# Patient Record
Sex: Female | Born: 1980 | Hispanic: Yes | Marital: Married | Smoking: Never smoker
Health system: Southern US, Community
[De-identification: ages and names within clinical notes are randomized; demographics above are authoritative.]

---

## 2003-11-03 ENCOUNTER — Inpatient Hospital Stay (HOSPITAL_COMMUNITY): Admission: AD | Admit: 2003-11-03 | Discharge: 2003-11-03 | Payer: Self-pay | Admitting: Obstetrics & Gynecology

## 2004-04-15 ENCOUNTER — Inpatient Hospital Stay (HOSPITAL_COMMUNITY): Admission: AD | Admit: 2004-04-15 | Discharge: 2004-04-18 | Payer: Self-pay | Admitting: Obstetrics

## 2005-08-08 IMAGING — US US OB COMP +14 WK
1 series · 13 of 28 positions shown · non-contrast
Comparison: none

CLINICAL DATA: 15 week 0 day gestational age by LMP.  Pelvic pain.

[Series 1: unknown · 0.27mm/px · 13 of 43 slices shown]
[im 2/43]
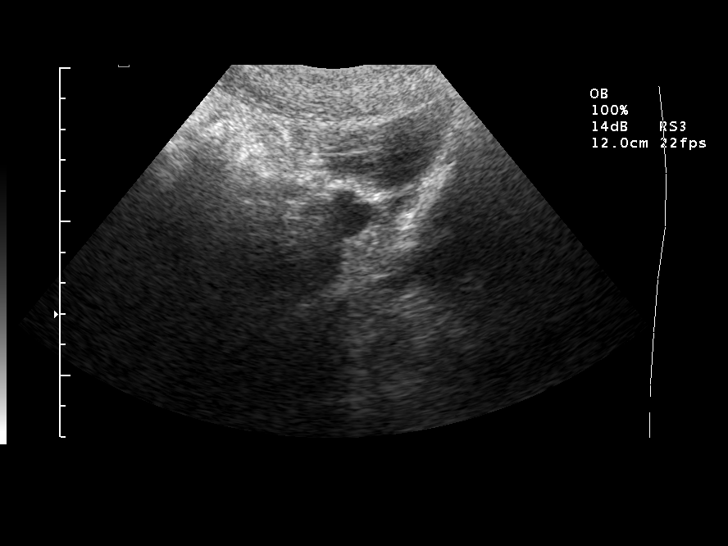
[im 5/43]
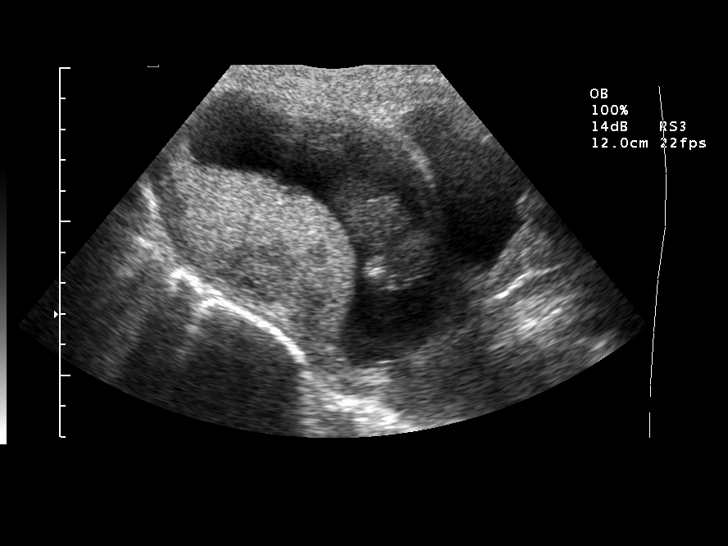
[im 8/43]
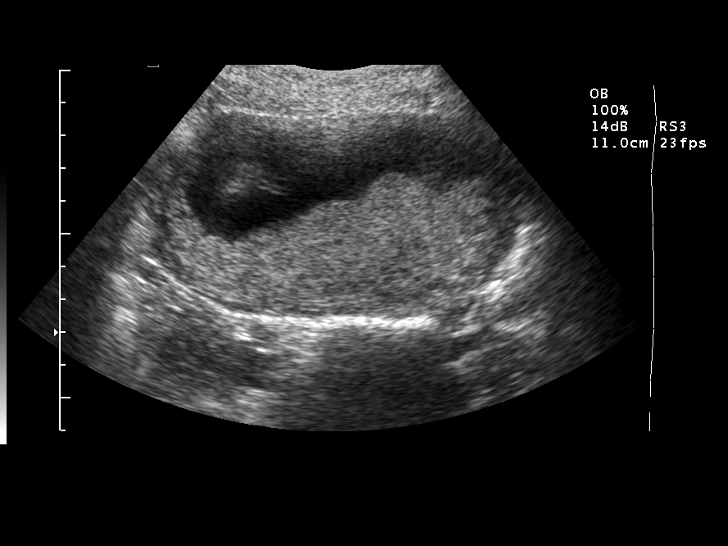
[im 11/43]
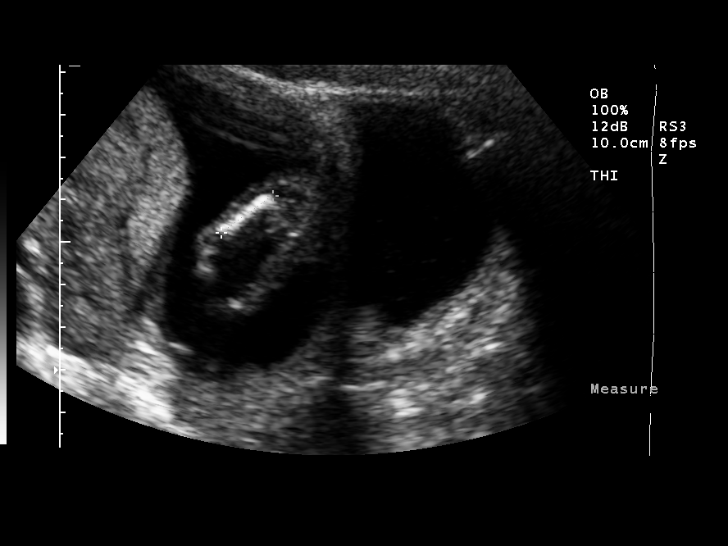
[im 15/43]
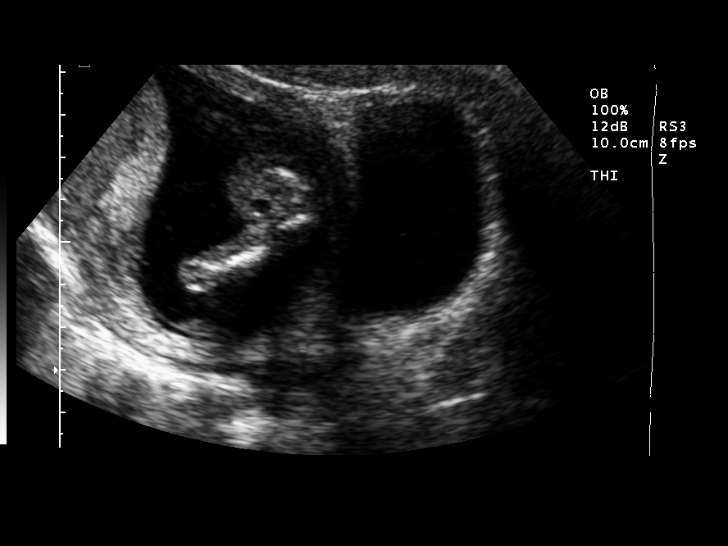
[im 18/43]
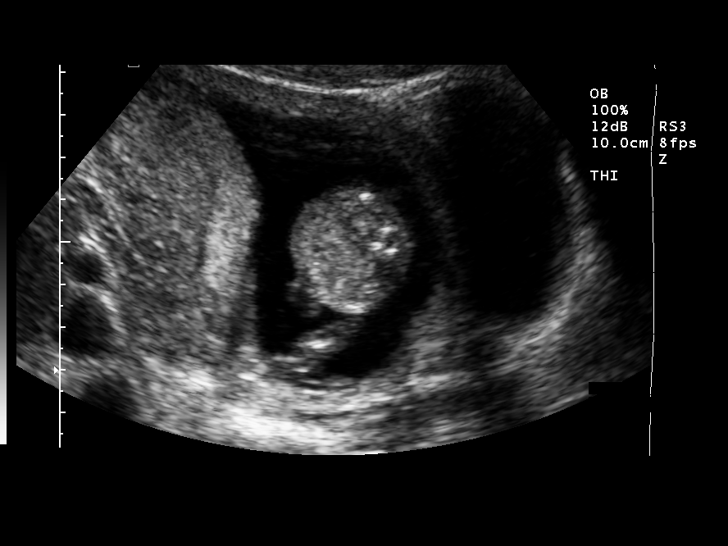
[im 22/43]
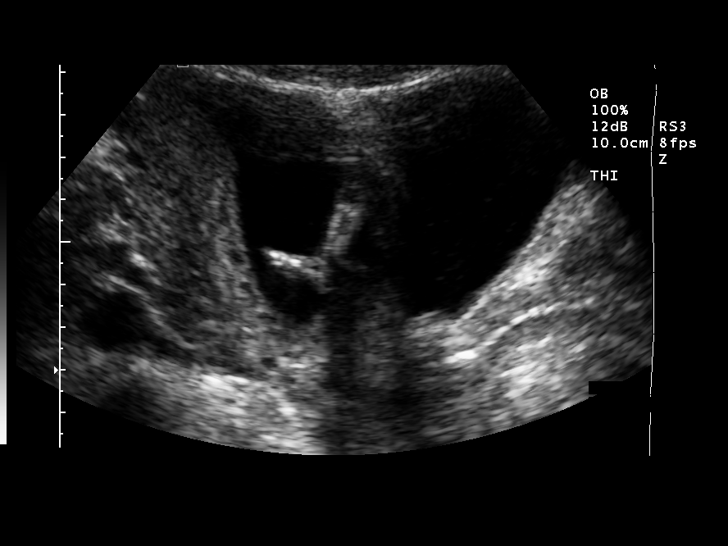
[im 25/43]
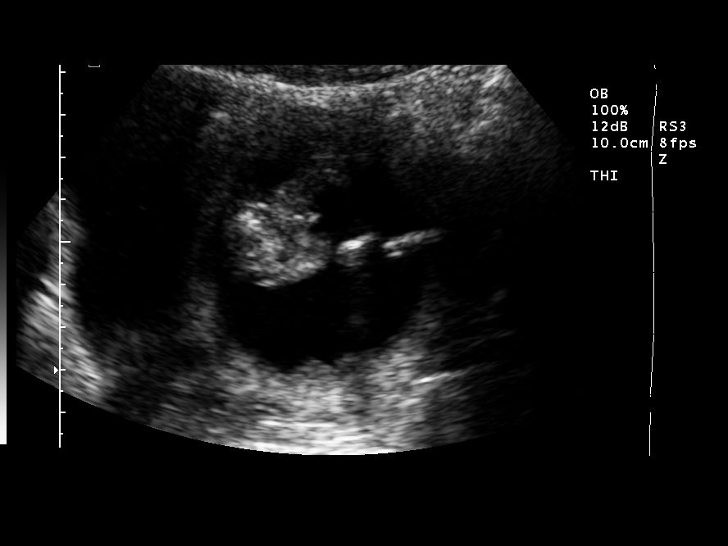
[im 29/43]
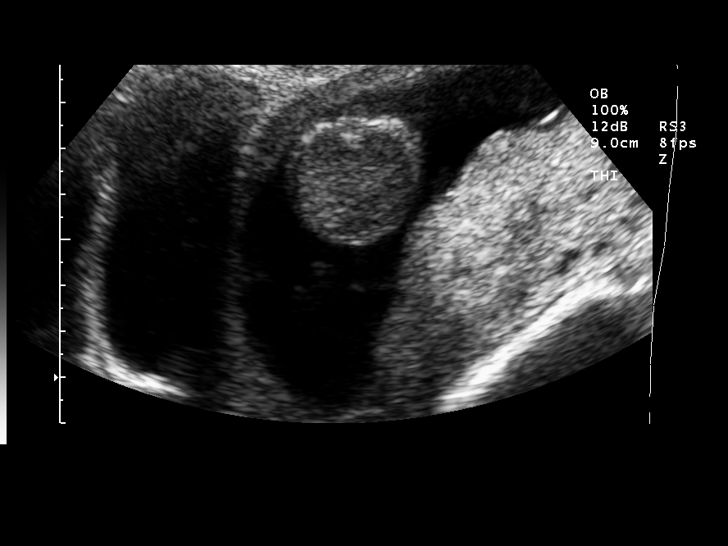
[im 32/43]
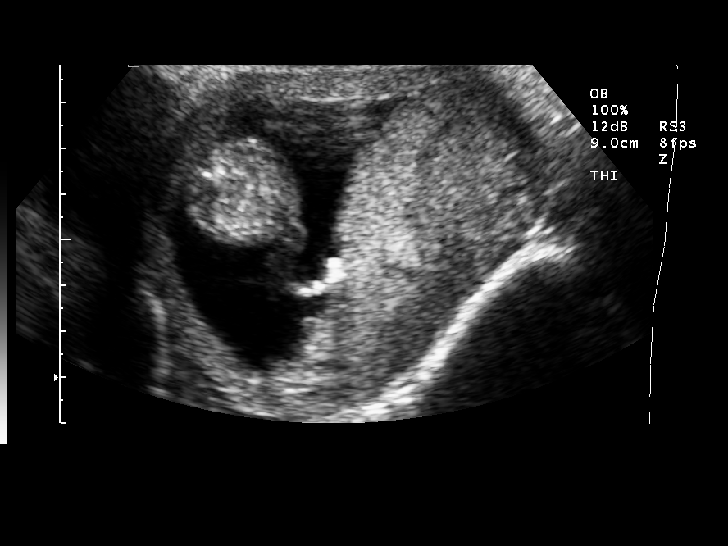
[im 35/43]
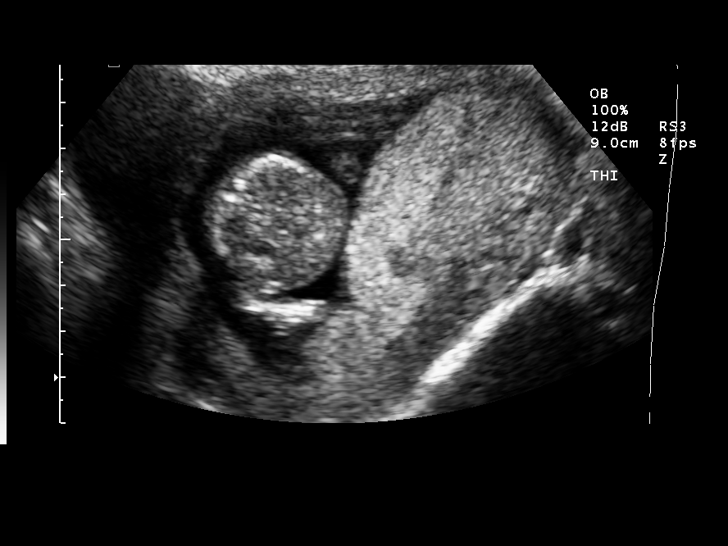
[im 38/43]
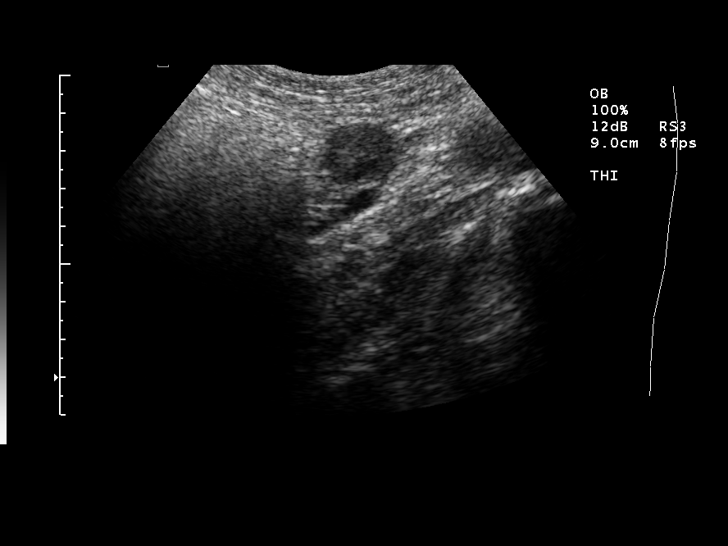
[im 41/43]
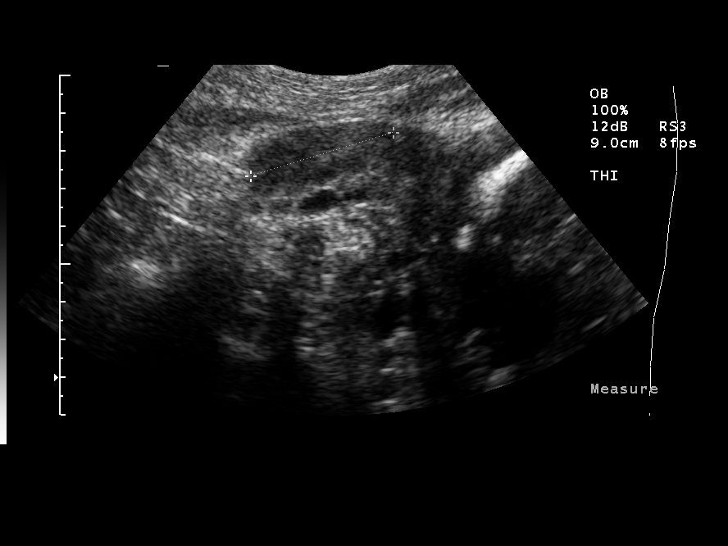

[13 of 28 positions shown; findings below may reference images not displayed]

OBSTETRICAL ULTRASOUND
 Number of Fetuses:  1
 Heart Rate:  150
 Movement:  Yes
 Breathing:  No  
 Presentation:  Breech
 Placental Location:  Posterior
 Grade:  I
 Previa:  No
 Amniotic Fluid (Subjective):  Normal
 Amniotic Fluid (Objective):   5.4 cm Vertical pocket 

 FETAL BIOMETRY
 BPD:   3.0 cm   15 w 4 d
 HC:   11.0 cm   15 w 2 d
 AC:   9.4 cm   15 w 4 d
 FL:   1.5 cm   14 w 3 d

 MEAN GA:  15 w 2 d

 FETAL ANATOMY
 Lateral Ventricles:    Choroid plexus visualized
 Thalami/CSP:      Not visualized 
 Posterior Fossa:  Not visualized 
 Nuchal Region:    Not visualized 
 Spine:      Not visualized 
 4 Chamber Heart on Left:      Not visualized 
 Stomach on Left:      Visualized 
 3 Vessel Cord:    Visualized 
 Cord Insertion site:    Visualized 
 Kidneys:  Visualized 
 Bladder:  Visualized 
 Extremities:      Visualized 

 Evaluation limited by:  Early gestational age 

 MATERNAL FINDINGS
 Cervix:   3.6 cm Transabdominally. 
 The right ovary is normal in appearance.  The left ovary is not visualized, but no left adnexal masses or free pelvic fluid are detected.
IMPRESSION: Single living intrauterine fetus with mean gestational age of 15 weeks 2 days and sonographic EDC of 04/24/04.  This correlates closely with stated LMP.  
 No gross fetal morphologic abnormalities seen, although complete anatomic evaluation could not be performed due to the early gestational age.  Follow-up ultrasound is recommended to evaluate fetal anatomy between 18 and 20 weeks.  
 No maternal uterine or adnexal abnormalities identified.

## 2005-08-23 DIAGNOSIS — R87619 Unspecified abnormal cytological findings in specimens from cervix uteri: Secondary | ICD-10-CM | POA: Insufficient documentation

## 2008-04-28 ENCOUNTER — Inpatient Hospital Stay (HOSPITAL_COMMUNITY): Admission: AD | Admit: 2008-04-28 | Discharge: 2008-04-28 | Payer: Self-pay | Admitting: Obstetrics

## 2008-05-01 ENCOUNTER — Inpatient Hospital Stay (HOSPITAL_COMMUNITY): Admission: AD | Admit: 2008-05-01 | Discharge: 2008-05-01 | Payer: Self-pay | Admitting: Obstetrics

## 2008-05-02 ENCOUNTER — Ambulatory Visit: Payer: Self-pay | Admitting: Obstetrics & Gynecology

## 2008-05-02 ENCOUNTER — Inpatient Hospital Stay (HOSPITAL_COMMUNITY): Admission: AD | Admit: 2008-05-02 | Discharge: 2008-05-05 | Payer: Self-pay | Admitting: Obstetrics

## 2009-04-01 ENCOUNTER — Emergency Department (HOSPITAL_COMMUNITY): Admission: EM | Admit: 2009-04-01 | Discharge: 2009-04-01 | Payer: Self-pay | Admitting: Emergency Medicine

## 2010-02-04 IMAGING — US US FETAL BPP W/O NONSTRESS
1 series · 14 of 18 positions shown · non-contrast
Comparison: none

OBSTETRICAL ULTRASOUND:
 This ultrasound exam was performed in the [HOSPITAL] Ultrasound Department.  The OB US report was generated in the AS system, and faxed to the ordering physician.  This report is also available in [REDACTED] PACS.

[Series 1: us fetal bpp w/o nonstress · 0.30mm/px · 14 of 18 slices shown]
[im 1/18]
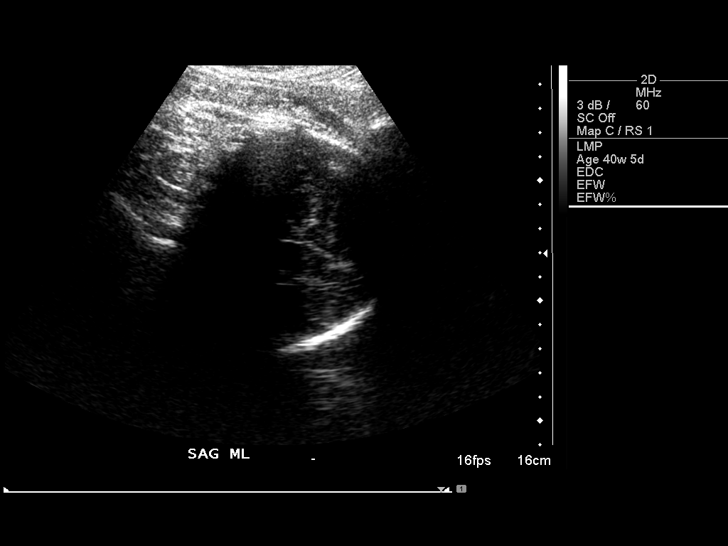
[im 2/18]
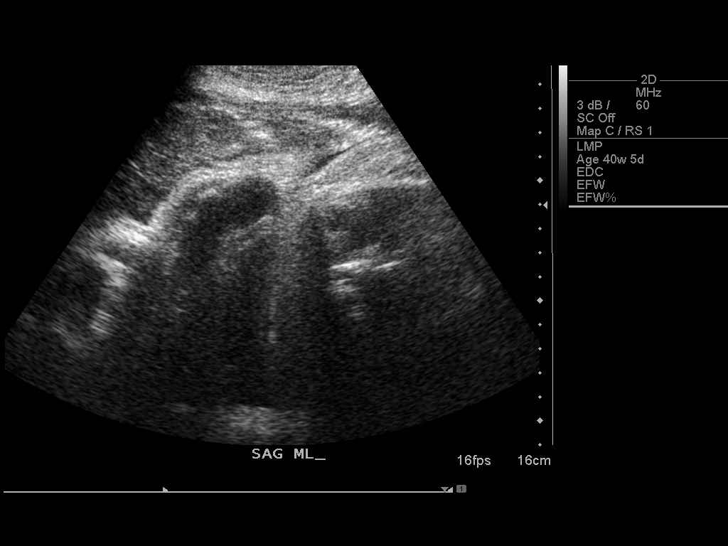
[im 4/18]
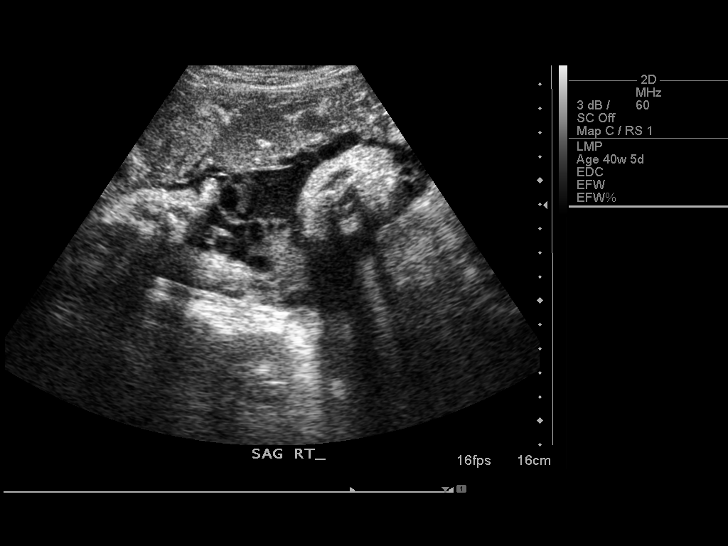
[im 5/18]
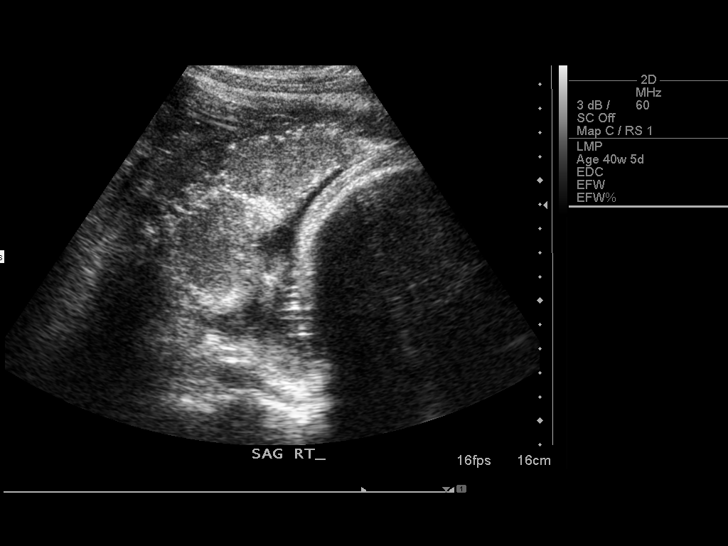
[im 6/18]
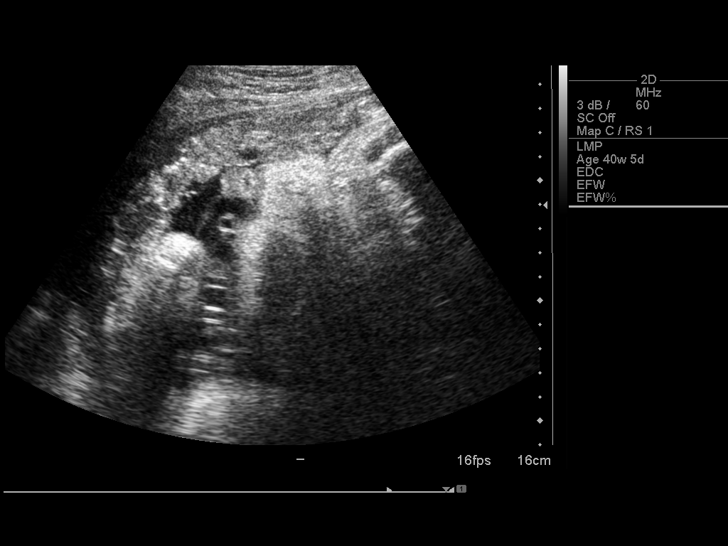
[im 8/18]
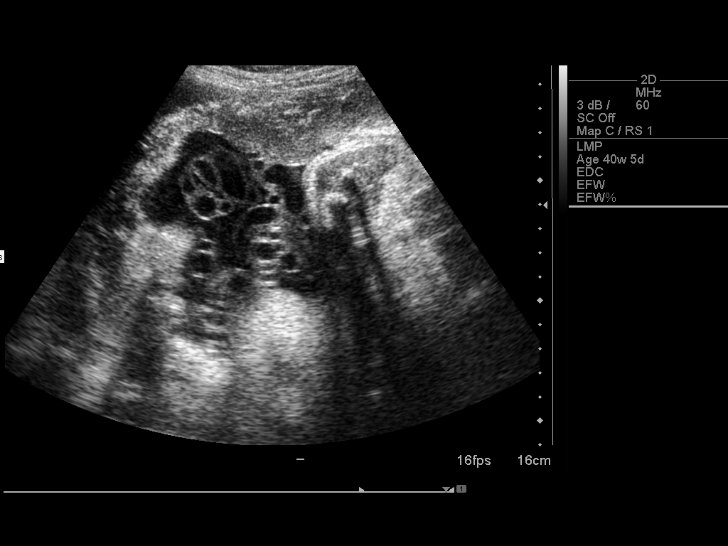
[im 9/18]
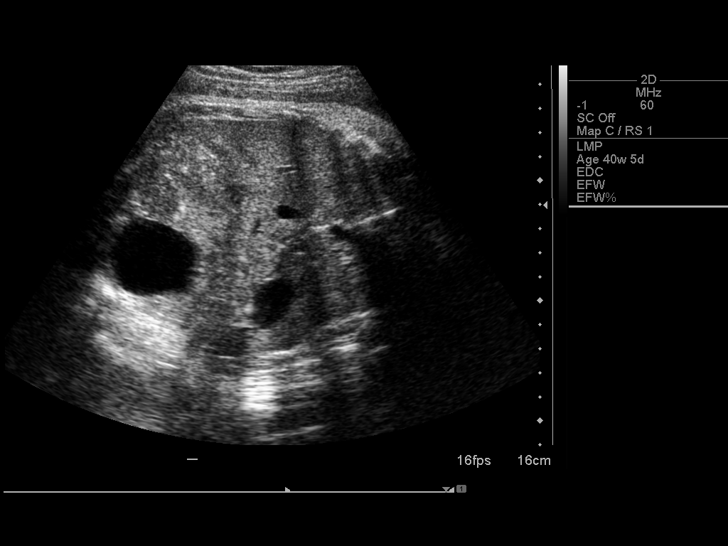
[im 10/18]
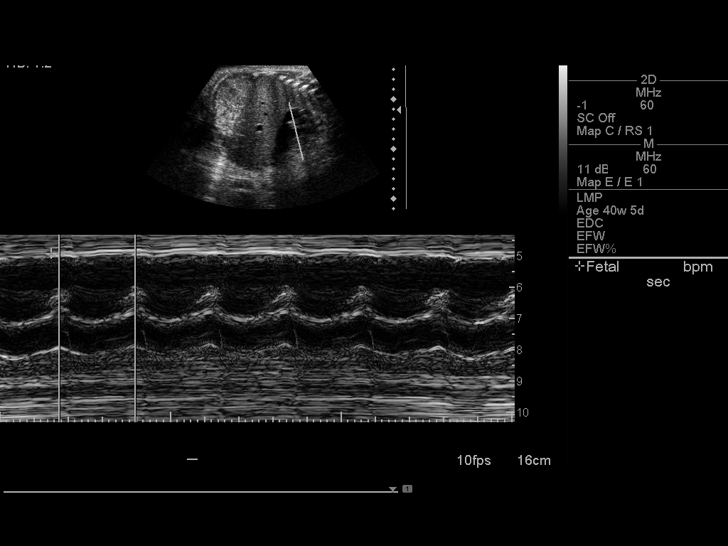
[im 11/18]
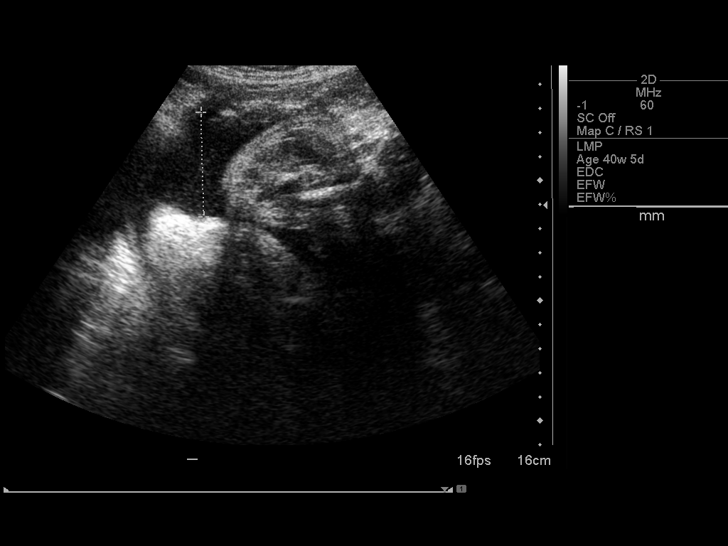
[im 13/18]
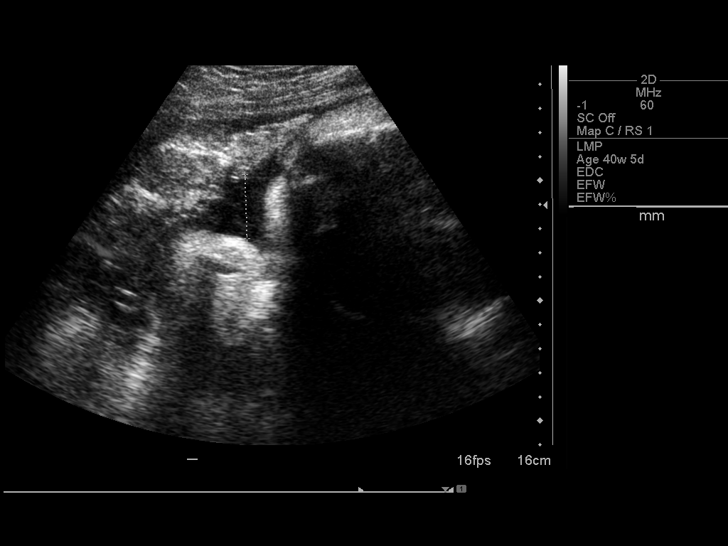
[im 14/18]
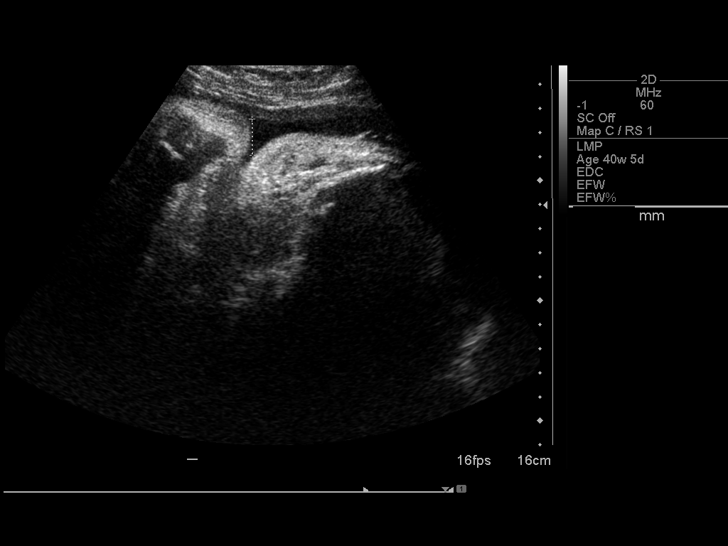
[im 15/18]
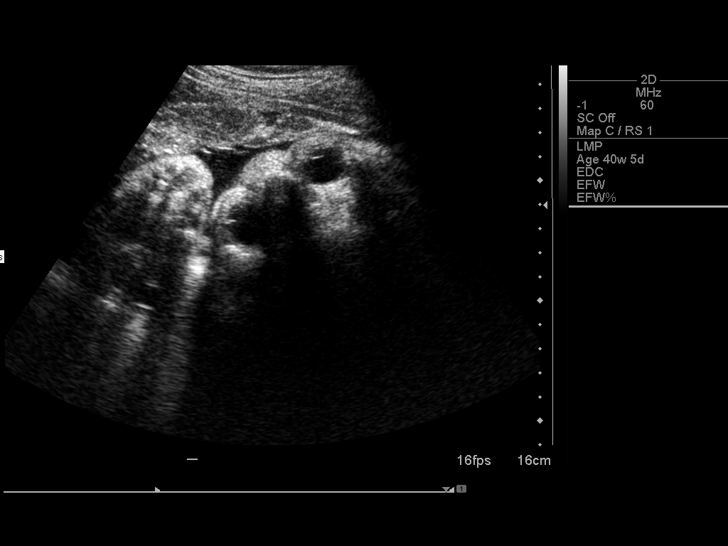
[im 17/18]
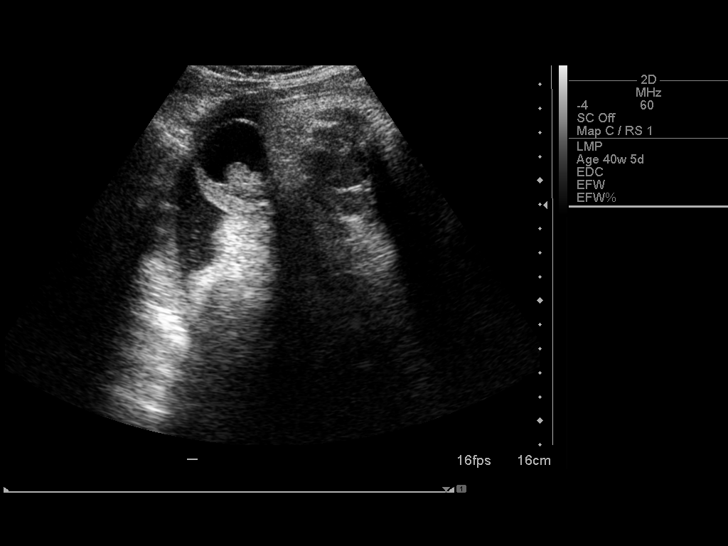
[im 18/18]
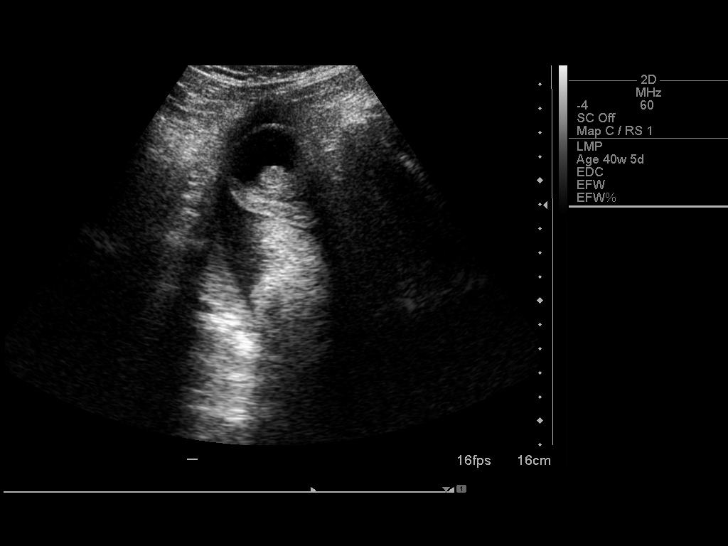

[14 of 18 positions shown; findings below may reference images not displayed]

IMPRESSION: See AS Obstetric US report.

## 2010-05-21 ENCOUNTER — Ambulatory Visit: Payer: Self-pay | Admitting: Internal Medicine

## 2010-08-04 ENCOUNTER — Telehealth (INDEPENDENT_AMBULATORY_CARE_PROVIDER_SITE_OTHER): Payer: Self-pay | Admitting: Internal Medicine

## 2010-08-04 ENCOUNTER — Ambulatory Visit: Payer: Self-pay | Admitting: Internal Medicine

## 2010-08-04 ENCOUNTER — Encounter (INDEPENDENT_AMBULATORY_CARE_PROVIDER_SITE_OTHER): Payer: Self-pay | Admitting: Internal Medicine

## 2010-08-04 LAB — CONVERTED CEMR LAB
Bilirubin Urine: NEGATIVE
Glucose, Urine, Semiquant: NEGATIVE
KOH Prep: NEGATIVE
Pap Smear: NEGATIVE
Specific Gravity, Urine: 1.025
Whiff Test: NEGATIVE

## 2010-08-10 ENCOUNTER — Encounter (INDEPENDENT_AMBULATORY_CARE_PROVIDER_SITE_OTHER): Payer: Self-pay | Admitting: Internal Medicine

## 2010-08-16 ENCOUNTER — Encounter (INDEPENDENT_AMBULATORY_CARE_PROVIDER_SITE_OTHER): Payer: Self-pay | Admitting: Internal Medicine

## 2010-08-25 ENCOUNTER — Ambulatory Visit
Admission: RE | Admit: 2010-08-25 | Discharge: 2010-08-25 | Payer: Self-pay | Source: Home / Self Care | Attending: Internal Medicine | Admitting: Internal Medicine

## 2010-08-25 ENCOUNTER — Encounter (INDEPENDENT_AMBULATORY_CARE_PROVIDER_SITE_OTHER): Payer: Self-pay | Admitting: Internal Medicine

## 2010-08-25 LAB — CONVERTED CEMR LAB
AST: 16 units/L (ref 0–37)
Albumin: 4.5 g/dL (ref 3.5–5.2)
BUN: 10 mg/dL (ref 6–23)
Basophils Relative: 0 % (ref 0–1)
Chloride: 104 meq/L (ref 96–112)
Eosinophils Absolute: 0 10*3/uL (ref 0.0–0.7)
Lymphocytes Relative: 33 % (ref 12–46)
Lymphs Abs: 1.5 10*3/uL (ref 0.7–4.0)
MCV: 88.6 fL (ref 78.0–100.0)
Monocytes Absolute: 0.3 10*3/uL (ref 0.1–1.0)
Monocytes Relative: 7 % (ref 3–12)
Neutro Abs: 2.7 10*3/uL (ref 1.7–7.7)
RDW: 13.2 % (ref 11.5–15.5)
Total Bilirubin: 0.7 mg/dL (ref 0.3–1.2)
Total CHOL/HDL Ratio: 3
WBC: 4.6 10*3/uL (ref 4.0–10.5)

## 2010-09-10 ENCOUNTER — Ambulatory Visit: Admit: 2010-09-10 | Payer: Self-pay | Admitting: Obstetrics and Gynecology

## 2010-09-14 ENCOUNTER — Encounter (INDEPENDENT_AMBULATORY_CARE_PROVIDER_SITE_OTHER): Payer: Self-pay | Admitting: Internal Medicine

## 2010-09-22 NOTE — Assessment & Plan Note (Signed)
Summary: **NEW PT REQ CPP, BIRTH CONTROLS/LR   Vital Signs:  Patient profile:   30 year old female LMP:     04/23/2009 Height:      58 inches Weight:      139.4 pounds BMI:     29.24 Temp:     98.9 degrees F oral Pulse rate:   74 / minute Pulse rhythm:   regular Resp:     18 per minute BP sitting:   102 / 68  (left arm) Cuff size:   regular  Vitals Entered By: Michelle Nasuti (May 21, 2010 9:50 AM) CC: establish care.pt request cpp and referral to have bitrh control method changed. currently pt has implanon. states it gives her HA and no period x 1 year. request IUD. pt has had this form in the past with no problems Is Patient Diabetic? No Pain Assessment Patient in pain? no       Does patient need assistance? Functional Status Self care Ambulation Normal LMP (date): 04/23/2009     Enter LMP: 04/23/2009   CC:  establish care.pt request cpp and referral to have bitrh control method changed. currently pt has implanon. states it gives her HA and no period x 1 year. request IUD. pt has had this form in the past with no problems.  History of Present Illness: 30 yo female here to establish.    Concerns:  1.  Birth Control:  Since implantation of Implanon 1 year ago, has headaches every 3rd day.  Also, concerned because she has not had a period since Implanon placed.  Pt. states she did not have problems with headaches prior to Implanon.  Interested in having IUD placed and Implanon removed.  Pt. is married.  No history of PID.  Has had IUD before without problems.  Has not had pap smear in 2 years.    Current Medications (verified): 1)  None  Allergies (verified): No Known Drug Allergies  Past History:  Past Surgical History: 1.  2009:  C section  for abnormal presentation.  Family History: Mother, 108:  arthritis, hypertension Father, 56: She thinks he is healthy 5 Brothers:  1 with mental health issues--not sure of diagnosis. 3 Sisters: Healthy Son, 6:   Healthy Son, 2:  Healthy  Social History: Originally from [place]. for 6 years. 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 Tobacco Use:  never Alcohol:  no Drug:  never.  Physical Exam  Lungs:  Normal respiratory effort, chest expands symmetrically. Lungs are clear to auscultation, no crackles or wheezes. Heart:  Normal rate and regular rhythm. S1 and S2 normal without gallop, murmur, click, rub or other extra sounds.  Radial pulses normal and equal   Impression & Recommendations:  Problem # 1:  CONTRACEPTIVE MANAGEMENT (ICD-V25.09) At discharge, pt. having change of heart--would like to rethink having Implanon out. She will either call if she wants the referral for IUD or wait until her CPP visit.  Complete Medication List: 1)  Implanon 68 Mg Impl (Etonogestrel) .... Placed in left upper arm 1 year ago  Patient Instructions: 1)  CPP with [facility] next available.

## 2010-09-24 NOTE — Assessment & Plan Note (Signed)
Summary: CPP////KT   Vital Signs:  Patient profile:   30 year old female Height:      58 inches Weight:      140.7 pounds BSA:     1.57 Temp:     97.5 degrees F oral Pulse rate:   78 / minute Pulse rhythm:   regular Resp:     18 per minute BP sitting:   92 / 58  (left arm) Cuff size:   regular  Vitals Entered By: Gaylyn Cheers RN (August 04, 2010 2:25 PM) CC: 30 y/o CPP. Had Mirena placed in 3/10 and has not had a menstrual period since then. This is causing her some concerns. Would like to have an IUD instead.   Is Patient Diabetic? No Pain Assessment Patient in pain? no       Does patient need assistance? Functional Status Self care Ambulation Normal  Vision Screening:Left eye w/o correction: 20 / 25 Right Eye w/o correction: 20 / 25 Both eyes w/o correction:  20/ 20 -1        Vision Entered By: Gaylyn Cheers RN (August 04, 2010 2:38 PM)   CC:  30 y/o CPP. Had Mirena placed in 3/10 and has not had a menstrual period since then. This is causing her some concerns. Would like to have an IUD instead.  Marland Kitchen  History of Present Illness: 30 yo female here for CPP.  Concerns:    1.  Has Implanon, not Mirena, in place.  Does want to go ahead with IUD placement at [facility].    Current Medications (verified): 1)  Implanon 68 Mg Impl (Etonogestrel) .... Placed in Left Upper Arm 1 Year Ago  Allergies (verified): No Known Drug Allergies  Past History:  Past Medical History: CONTRACEPTIVE MANAGEMENT (ICD-V25.09) Hx of PAP SMEAR, ABNORMAL (ICD-795.00)  Review of Systems General:  Energy is fine. Eyes:  Denies blurring. ENT:  Denies decreased hearing. CV:  Denies chest pain or discomfort and palpitations. Resp:  Denies shortness of breath. GI:  Denies bloody stools and dark tarry stools; Has cramping coming and going like will start period Constipation when working--does not stay hydrated when working... GU:  Denies discharge, dysuria, and urinary  frequency. MS:  Denies joint pain, joint redness, and joint swelling. Derm:  Denies lesion(s) and rash. Neuro:  Denies numbness, tingling, and weakness. Psych:  Denies anxiety, depression, and suicidal thoughts/plans; PHQ9 scored 1.  Physical Exam  General:  Well-developed,well-nourished,in no acute distress; alert,appropriate and cooperative throughout examination Head:  Normocephalic and atraumatic without obvious abnormalities. No apparent alopecia or balding. Eyes:  No corneal or conjunctival inflammation noted. EOMI. Perrla. Funduscopic exam benign, without hemorrhages, exudates or papilledema. Vision grossly normal. Ears:  External ear exam shows no significant lesions or deformities.  Otoscopic examination reveals clear canals, tympanic membranes are intact bilaterally without bulging, retraction, inflammation or discharge. Hearing is grossly normal bilaterally. Nose:  External nasal examination shows no deformity or inflammation. Nasal mucosa are pink and moist without lesions or exudates. Mouth:  Oral mucosa and oropharynx without lesions or exudates.  Teeth in good repair. Neck:  No deformities, masses, or tenderness noted. Breasts:  No mass, nodules, thickening, tenderness, bulging, retraction, inflamation, nipple discharge or skin changes noted.   Lungs:  Normal respiratory effort, chest expands symmetrically. Lungs are clear to auscultation, no crackles or wheezes. Heart:  Normal rate and regular rhythm. S1 and S2 normal without gallop, murmur, click, rub or other extra sounds. Abdomen:  Bowel sounds positive,abdomen soft and  non-tender without masses, organomegaly or hernias noted. Genitalia:  Pelvic Exam:        External: normal female genitalia without lesions or masses        Vagina: normal without lesions or masses.  Scant old blood in vaginal canal.        Cervix: normal without lesions or masses        Adnexa: normal bimanual exam without masses or fullness        Uterus:  normal by palpation        Pap smear: performed Msk:  No deformity or scoliosis noted of thoracic or lumbar spine.   Pulses:  R and L carotid,radial,femoral,dorsalis pedis and posterior tibial pulses are full and equal bilaterally Extremities:  No clubbing, cyanosis, edema, or deformity noted with normal full range of motion of all joints.   Neurologic:  No cranial nerve deficits noted. Station and gait are normal. Plantar reflexes are down-going bilaterally. DTRs are symmetrical throughout. Sensory, motor and coordinative functions appear intact. Skin:  Dark brown well circumbscribed domed lesion, 1/2 cm on right upper back Cervical Nodes:  No lymphadenopathy noted Axillary Nodes:  No palpable lymphadenopathy Inguinal Nodes:  No significant adenopathy Psych:  Cognition and judgment appear intact. Alert and cooperative with normal attention span and concentration. No apparent delusions, illusions, hallucinations   Impression & Recommendations:  Problem # 1:  ROUTINE GYNECOLOGICAL EXAMINATION (ICD-V72.31)  Orders: UA Dipstick w/o Micro (automated)  (81003) Pap Smear, Thin Prep ( Collection of) (Q0091) T- GC Chlamydia ([address]) T-Pap Smear, Thin Prep (60454)  Problem # 2:  Preventive Health Care (ICD-V70.0) Flu vaccine Tdap not available today  Problem # 3:  CONTRACEPTIVE MANAGEMENT (ICD-V25.09) Referral for removal of Implanon and insertion of IUD--pt. preference  Complete Medication List: 1)  Implanon 68 Mg Impl (Etonogestrel) .... Placed in left upper arm 1 year ago  Other Orders: Flu Vaccine 25yrs + [phone]) Admin 1st Vaccine (91478)  Patient Instructions: 1)  Calcium 500 mg con 200 International Units of Vitamin D dos veces al dia 2)  Schedule fasting lab visit in next 3 weeks:  CMET, CBC, FLP   Orders Added: 1)  Flu Vaccine 64yrs + [90658] 2)  Admin 1st Vaccine [90471] 3)  UA Dipstick w/o Micro (automated)  [81003] 4)  Pap Smear, Thin Prep ( Collection of) [Q0091] 5)  T-  GC Chlamydia [29562] 6)  T-Pap Smear, Thin Prep [88142] 7)  Est. Patient age 67-39 [109]   Immunizations Administered:  Influenza Vaccine # 1:    Vaccine Type: Fluvax 3+    Site: left deltoid    Mfr: GlaxoSmithKline    Dose: 0.5 ml    Route: IM    Given by: Hale Drone CMA    Exp. Date: 02/20/2011    Lot #: [id number]    VIS given: 03/17/10 version given August 04, 2010.  Flu Vaccine Consent Questions:    Do you have a history of severe allergic reactions to this vaccine? no    Any prior history of allergic reactions to egg and/or gelatin? no    Do you have a sensitivity to the preservative Thimersol? no    Do you have a past history of Guillan-Barre Syndrome? no    Do you currently have an acute febrile illness? no    Have you ever had a severe reaction to latex? no    Vaccine information given and explained to patient? yes    Are you currently pregnant? no   Immunizations Administered:  Influenza Vaccine # 1:    Vaccine Type: Fluvax 3+    Site: left deltoid    Mfr: GlaxoSmithKline    Dose: 0.5 ml    Route: IM    Given by: Hale Drone CMA    Exp. Date: 02/20/2011    Lot #: [id number]    VIS given: 03/17/10 version given August 04, 2010.    Preventive Care Screening     Last pap:  2 years ago.   Possibly abnormal in 2007--did not follow through with follow up  LMP:  No period for 2 years.  Though has been spotting for about 1 month SBE:  No Osteoprevention: One serving of cheese daily.  Not physically active. Immunizations:  Tdap: cannot recall last one.   No flu vaccine until today.   Laboratory Results   Urine Tests  Date/Time Received: August 04, 2010 2:48 PM   Routine Urinalysis   Color: lt. yellow Glucose: negative   (Normal Range: Negative) Bilirubin: negative   (Normal Range: Negative) Ketone: negative   (Normal Range: Negative) Spec. Gravity: 1.025   (Normal Range: 1.003-1.035) Blood: trace-intact   (Normal Range: Negative) pH: 6.5    (Normal Range: 5.0-8.0) Protein: negative   (Normal Range: Negative) Urobilinogen: 0.2   (Normal Range: 0-1) Nitrite: negative   (Normal Range: Negative) Leukocyte Esterace: negative   (Normal Range: Negative)      [place]et [place] Source: vaginal WBC/hpf: 1-5 Bacteria/hpf: 1+ Clue cells/hpf: none  Negative whiff Yeast/hpf: none [place] KOH: Negative Trichomonas/hpf: none   Laboratory Results   Urine Tests    Routine Urinalysis   Color: lt. yellow Glucose: negative   (Normal Range: Negative) Bilirubin: negative   (Normal Range: Negative) Ketone: negative   (Normal Range: Negative) Spec. Gravity: 1.025   (Normal Range: 1.003-1.035) Blood: trace-intact   (Normal Range: Negative) pH: 6.5   (Normal Range: 5.0-8.0) Protein: negative   (Normal Range: Negative) Urobilinogen: 0.2   (Normal Range: 0-1) Nitrite: negative   (Normal Range: Negative) Leukocyte Esterace: negative   (Normal Range: Negative)      [place]/KOH  Negative whiff

## 2010-09-24 NOTE — Letter (Signed)
Summary: Lipid Letter  [facility]  [address] [place], [place] [address]   Phone: [phone]  Fax: [phone]    09/14/2010  [facility] [address] [place], [place]  [address]  Dear [name]:  We have carefully reviewed your last lipid profile from 08/25/2010 and the results are noted below with a summary of recommendations for lipid management.    Cholesterol:       123     Goal: <200   HDL "good" Cholesterol:   41     Goal: >45   LDL "bad" Cholesterol:   68     Goal: <100   Triglycerides:       68     Goal: <150    Cholesterol is quite good, though exercise would bring up your "good" portion.  Rest of labs and pap were fine.    TLC Diet (Therapeutic Lifestyle Change): Saturated Fats & Transfatty acids should be kept < 7% of total calories ***Reduce Saturated Fats Polyunstaurated Fat can be up to 10% of total calories Monounsaturated Fat Fat can be up to 20% of total calories Total Fat should be no greater than 25-35% of total calories Carbohydrates should be 50-60% of total calories Protein should be approximately 15% of total calories Fiber should be at least 20-30 grams a day ***Increased fiber may help lower LDL Total Cholesterol should be < 200mg /day Consider adding plant stanol/sterols to diet (example: Benacol spread) ***A higher intake of unsaturated fat may reduce Triglycerides and Increase HDL    Adjunctive Measures (may lower LIPIDS and reduce risk of Heart Attack) include: Aerobic Exercise (20-30 minutes 3-4 times a week) Limit Alcohol Consumption Weight Reduction Aspirin 75-81 mg a day by mouth (if not allergic or contraindicated) Dietary Fiber 20-30 grams a day by mouth     Current Medications: 1)    Implanon 68 Mg Impl (Etonogestrel) .... Placed in left upper arm 1 year ago  If you have any questions, please call. We appreciate being able to work with you.   Sincerely,    [facility]

## 2010-09-24 NOTE — Letter (Signed)
Summary: *HSN Results Follow up  [facility]  [address] [place], [place] [address]   Phone: [phone]  Fax: [phone]      08/16/2010   [facility] [address] [place], [place]  [address]   Dear  Ms. Deshante PERALTA-LUCERO,                            ____S.Drinkard,FNP   ____D. Gore,FNP       ____B. McPherson,MD   ____V. Rankins,MD    __X__E. Mulberry,MD    ____N. Daphine Deutscher, FNP  ____D. Reche Dixon, MD    ____K. Philipp Deputy, MD    ____Other     This letter is to inform you that your recent test(s):  ___X____Pap Smear    ____X___Lab Test     _______X-ray    ___X____ is within acceptable limits  _______ requires a medication change  _______ requires a follow-up lab visit  _______ requires a follow-up visit with your provider   Comments:       _________________________________________________________ If you have any questions, please contact our office                     Sincerely,  Julieanne Manson MD [facility]

## 2010-09-24 NOTE — Progress Notes (Signed)
Summary: gynecology referral  Phone Note Outgoing Call   Summary of Call: [name]--referral for Implanon removal and IUD placement.  Send last 2 OV notes Initial call taken by: Julieanne Manson MD,  August 04, 2010 3:58 PM  Follow-up for Phone Call        SEND A REFERRAL TO [facility] WAITING FOR AN APPT  Follow-up by: Cheryll Dessert,  August 05, 2010 5:57 PM

## 2010-09-24 NOTE — Progress Notes (Signed)
Summary: Office Visit//DEPRESSION SCREENING  Office Visit//DEPRESSION SCREENING   Imported By: Arta Bruce 08/10/2010 16:10:25  _____________________________________________________________________  External Attachment:    Type:   Image     Comment:   External Document

## 2011-01-05 NOTE — H&P (Signed)
[name], [name]        ACCOUNT NO.:  [id number]   MEDICAL RECORD NO.:  [id number]          PATIENT TYPE:  INP   LOCATION:  9114                          FACILITY:  [facility]   PHYSICIAN:  Kathreen Cosier, M.D.DATE OF BIRTH:  [birth year]   DATE OF ADMISSION:  05/02/2008  DATE OF DISCHARGE:                              HISTORY & PHYSICAL   The patient is a 30 year old gravida 2, para 1-0-1, EDC on April 26, 2008, in for post dates induction.  Negative GBS.  Cervix 2 cm, 100%  vertex, -2 to -3 station.  The membranes were ruptured artificially.  The fluid was clear and she was started on Pitocin at 5:30 p.m.  By 7:30  p.m.; she was 8 cm with the vertex at -2 to 3 station, contracting every  2 minutes.  She got an epidural at that time.  By 11:25, she was 9-cm  vertex, -2 and -3.  At 1:30 a.m., she had not changed with adequate  labor.  So from 7:30 p.m., she was 8 cm with a vertex of -2 to -3, and  an adequate labor to the 1:30 in the morning, she was 9 cm with a lot of  molding.  We decided she would delivered by C-section because of failure  to progress.   PHYSICAL EXAMINATION:  HEENT:  Negative.  LUNGS:  Clear.  HEART:  Regular rhythm.  No murmurs.  No gallops.  BREASTS:  No masses.  ABDOMEN:  Term size uterus.  Estimated fetal weight 7 pounds 8 ounces.  EXTREMITIES:  Legs negative.           ______________________________  Kathreen Cosier, M.D.     BAM/MEDQ  D:  05/03/2008  T:  05/03/2008  Job:  [id number]

## 2011-01-05 NOTE — Op Note (Signed)
[name], [name]        ACCOUNT NO.:  [id number]   MEDICAL RECORD NO.:  [id number]          PATIENT TYPE:  INP   LOCATION:  9114                          FACILITY:  [facility]   PHYSICIAN:  Kathreen Cosier, M.D.DATE OF BIRTH:  01/26/1981   DATE OF PROCEDURE:  05/03/2008  DATE OF DISCHARGE:                               OPERATIVE REPORT   PREOPERATIVE DIAGNOSIS:  Failure to progress in labor.   POSTOPERATIVE DIAGNOSIS:  Failure to progress in labor.   SURGEON:  Kathreen Cosier, M.D.   ANESTHESIA:  Epidural.   PROCEDURE:  The patient placed on the operating table in supine  position.  Abdomen prepped and draped bladder emptied with Foley  catheter.  Transverse suprapubic incision made carried down to the  rectus fascia.  Fascia cleaned and incised in the length of the  incision.  Recti muscles retracted laterally.  Peritoneum incised  longitudinally.  Transverse incision made in the visceral peritoneum  above the bladder.  Bladder mobilized inferiorly.  Transverse lower  uterine incision made.  The patient delivered from the OP position of a  female.  Apgar 9 and 9, weighing 8 pounds 13 ounces.  The team was in  attendance.  Fluid was clear.  Placenta was anterior.  Fundal removed  manually and uterine cavity cleaned with dry laps.  The uterine incision  closed in 1 layer with continuous suture of #1 chromic.  Hemostasis was  satisfactory.  Bladder flap reattached with 2-0 chromic.  Uterus well  contracted.  Tubes and ovaries were normal.  Abdomen closed in layers.  Peritoneum continuous suture of 0 chromic, fascia continuous suture with  Dexon, skin closed with 4-0 Monocryl.  Blood loss 800 mL.  The patient  tolerated the procedure well and taken to the recovery room in good  condition.           ______________________________  Kathreen Cosier, M.D.     BAM/MEDQ  D:  05/03/2008  T:  05/03/2008  Job:  [id number]

## 2011-01-08 NOTE — Discharge Summary (Signed)
[name], [name]        ACCOUNT NO.:  [id number]   MEDICAL RECORD NO.:  [id number]          PATIENT TYPE:  INP   LOCATION:  9114                          [facility]ILITY:  [facility]   PHYSICIAN:  Kathreen Cosier, M.D.DATE OF BIRTH:  [birth year]   DATE OF ADMISSION:  05/02/2008  DATE OF DISCHARGE:  05/05/2008                               DISCHARGE SUMMARY   HOSPITAL COURSE:  The patient is a 30 year old gravida 2, para 1-0-0-1,  EDC was April 26, 2008.  She was admitted for induction post date,  cervix 2 cm, 100% vertex -2 to -3.  The patient underwent primary low  transverse cesarean section because of failure to progress in labor and  she had a female, Apgars 9 and 9, weighing 8 pounds and 13 ounces from the  OP position.  Postoperatively, she did well.  Her hemoglobin was 7.7.  She was asymptomatic.  Given ferrous sulfate 325 p.o. b.i.d.  She was  discharged on the second postoperative day because she desired early  discharge and was discharged on Tylox for pain and ferrous sulfate for  anemia.   DISCHARGE DIAGNOSIS:  Status post primary low transverse cesarean  section because of failure to progress in labor.           ______________________________  Kathreen Cosier, M.D.     BAM/MEDQ  D:  07/03/2008  T:  07/03/2008  Job:  [id number]

## 2011-05-26 LAB — CBC
HCT: 22.8 — ABNORMAL LOW
Hemoglobin: 11.1 — ABNORMAL LOW
MCV: 87.6
MCV: 87.8
Platelets: 145 — ABNORMAL LOW
Platelets: 183
RBC: 3.79 — ABNORMAL LOW
RDW: 14
RDW: 14.5
WBC: 7.3

## 2012-08-23 HISTORY — PX: TUBAL LIGATION: SHX77

## 2013-02-20 LAB — OB RESULTS CONSOLE HIV ANTIBODY (ROUTINE TESTING): HIV: NONREACTIVE

## 2013-02-20 LAB — OB RESULTS CONSOLE RUBELLA ANTIBODY, IGM: Rubella: IMMUNE

## 2013-02-20 LAB — OB RESULTS CONSOLE ABO/RH: RH Type: POSITIVE

## 2013-03-15 LAB — OB RESULTS CONSOLE GC/CHLAMYDIA: Chlamydia: NEGATIVE

## 2013-06-14 ENCOUNTER — Other Ambulatory Visit: Payer: Self-pay | Admitting: Obstetrics

## 2013-06-18 ENCOUNTER — Encounter (HOSPITAL_COMMUNITY): Payer: Self-pay

## 2013-06-19 ENCOUNTER — Encounter (HOSPITAL_COMMUNITY): Payer: Self-pay

## 2013-06-19 ENCOUNTER — Encounter (HOSPITAL_COMMUNITY)
Admission: RE | Admit: 2013-06-19 | Discharge: 2013-06-19 | Disposition: A | Discharge status: Home / Self Care | Payer: Self-pay | Source: Ambulatory Visit | Attending: Obstetrics | Admitting: Obstetrics

## 2013-06-19 DIAGNOSIS — Z01818 Encounter for other preprocedural examination: Secondary | ICD-10-CM | POA: Insufficient documentation

## 2013-06-19 DIAGNOSIS — Z01812 Encounter for preprocedural laboratory examination: Secondary | ICD-10-CM | POA: Insufficient documentation

## 2013-06-19 LAB — CBC
HCT: 32 % — ABNORMAL LOW (ref 36.0–46.0)
Hemoglobin: 11 g/dL — ABNORMAL LOW (ref 12.0–15.0)

## 2013-06-19 LAB — RAPID HIV SCREEN (WH-MAU): Rapid HIV Screen: NONREACTIVE

## 2013-06-19 LAB — ABO/RH: ABO/RH(D): B POS

## 2013-06-19 NOTE — Patient Instructions (Addendum)
Your procedure is scheduled on: 06/21/2013  Enter through the Main Entrance of [facility] at: [id number]  Pick up the phone at the desk and dial [phone].  Call this number if you have problems the morning of surgery: [phone].  Remember: Do NOT eat food: AFTER MIDNIGHT 06/20/2013 Do NOT drink clear liquids after: AFTER MIDNIGHT 06/20/2013  Take these medicines the morning of surgery with a SIP OF WATER: None  Do NOT wear jewelry (body piercing), make-up, or nail polish. Do NOT wear lotions, powders, or perfumes.  You may wear deoderant. Do NOT shave for 48 hours prior to surgery. Do NOT bring valuables to the hospital. Contacts, dentures, or bridgework may not be worn into surgery. Leave suitcase in car.  After surgery it may be brought to your room.  For patients admitted to the hospital, checkout time is 11:00 AM the day of discharge.

## 2013-06-19 NOTE — Pre-Procedure Instructions (Signed)
Used [employer] for [employer].

## 2013-06-20 NOTE — H&P (Signed)
[name], [name]        ACCOUNT NO.:  [id number]  MEDICAL RECORD NO.:  [id number]  LOCATION:  PERIO                         FACILITY:  [facility]  PHYSICIAN:  Kathreen Cosier, M.D.DATE OF BIRTH:  Mar 17, 1981  DATE OF ADMISSION:  06/14/2013 DATE OF DISCHARGE:                             HISTORY & PHYSICAL   PREOPERATIVE DIAGNOSIS:  Previous cesarean section at term, desires repeat and tubal ligation.  The patient is a 32 year old, gravida 3, para 2-0-0-2, who had a C- section with her last delivery.  Her due date is June 28, 2013 and she desires repeat C-section and tubal ligation.  Her GBS was negative, and her diabetic screen normal.  She had no complications with this pregnancy.  PAST MEDICAL HISTORY:  Negative.  PAST SURGICAL HISTORY:  C-section x1.  SOCIAL HISTORY:  Negative.  SYSTEM REVIEW:  Noncontributory.  PHYSICAL EXAMINATION:  GENERAL:  Well-developed female, in no distress. HEENT:  Negative. BREASTS:  Negative. LUNGS:  Clear to P and A. HEART:  Regular rhythm.  No murmurs, no gallops. ABDOMEN:  Term. PELVIC:  Cervix closed.  Vertex -3. EXTREMITIES:  Negative.          ______________________________ Kathreen Cosier, M.D.     BAM/MEDQ  D:  06/19/2013  T:  06/20/2013  Job:  [id number]

## 2013-06-21 ENCOUNTER — Encounter (HOSPITAL_COMMUNITY)
Admission: RE | Disposition: A | Discharge status: Home / Self Care | Payer: Self-pay | Source: Ambulatory Visit | Attending: Obstetrics

## 2013-06-21 ENCOUNTER — Inpatient Hospital Stay (HOSPITAL_COMMUNITY): Payer: Medicaid Other | Admitting: Anesthesiology

## 2013-06-21 ENCOUNTER — Inpatient Hospital Stay (HOSPITAL_COMMUNITY)
Admission: RE | Admit: 2013-06-21 | Discharge: 2013-06-24 | DRG: 766 | Disposition: A | Discharge status: Home / Self Care | Payer: Medicaid Other | Source: Ambulatory Visit | Attending: Obstetrics | Admitting: Obstetrics

## 2013-06-21 ENCOUNTER — Encounter (HOSPITAL_COMMUNITY): Payer: Self-pay | Admitting: *Deleted

## 2013-06-21 ENCOUNTER — Encounter (HOSPITAL_COMMUNITY): Payer: Medicaid Other | Admitting: Anesthesiology

## 2013-06-21 DIAGNOSIS — R87619 Unspecified abnormal cytological findings in specimens from cervix uteri: Secondary | ICD-10-CM

## 2013-06-21 DIAGNOSIS — Z302 Encounter for sterilization: Secondary | ICD-10-CM

## 2013-06-21 DIAGNOSIS — O34219 Maternal care for unspecified type scar from previous cesarean delivery: Principal | ICD-10-CM | POA: Diagnosis present

## 2013-06-21 LAB — PREPARE RBC (CROSSMATCH)

## 2013-06-21 SURGERY — Surgical Case
Anesthesia: Spinal | Site: Abdomen | Laterality: Bilateral | Wound class: Clean Contaminated

## 2013-06-21 MED ORDER — SIMETHICONE 80 MG PO CHEW
80.0000 mg | CHEWABLE_TABLET | Freq: Three times a day (TID) | ORAL | Status: DC
  Administered 2013-06-21 – 2013-06-24 (×8): 80 mg via ORAL
  Filled 2013-06-21 (×7): qty 1

## 2013-06-21 MED ORDER — INFLUENZA VAC SPLIT QUAD 0.5 ML IM SUSP
0.5000 mL | INTRAMUSCULAR | Status: DC
  Filled 2013-06-21: qty 0.5

## 2013-06-21 MED ORDER — ONDANSETRON HCL 4 MG/2ML IJ SOLN: 4.0000 mg | Freq: Three times a day (TID) | INTRAMUSCULAR | Status: DC | PRN

## 2013-06-21 MED ORDER — PHENYLEPHRINE HCL 10 MG/ML IJ SOLN
INTRAMUSCULAR | Status: AC
  Filled 2013-06-21: qty 1

## 2013-06-21 MED ORDER — ZOLPIDEM TARTRATE 5 MG PO TABS: 5.0000 mg | ORAL_TABLET | Freq: Every evening | ORAL | Status: DC | PRN

## 2013-06-21 MED ORDER — KETOROLAC TROMETHAMINE 30 MG/ML IJ SOLN
INTRAMUSCULAR | Status: AC
  Administered 2013-06-21: 30 mg via INTRAVENOUS
  Filled 2013-06-21: qty 1

## 2013-06-21 MED ORDER — KETOROLAC TROMETHAMINE 30 MG/ML IJ SOLN
30.0000 mg | Freq: Four times a day (QID) | INTRAMUSCULAR | Status: AC | PRN
  Administered 2013-06-21: 30 mg via INTRAVENOUS
  Filled 2013-06-21: qty 1

## 2013-06-21 MED ORDER — CEFAZOLIN SODIUM-DEXTROSE 2-3 GM-% IV SOLR
INTRAVENOUS | Status: AC
  Filled 2013-06-21: qty 50

## 2013-06-21 MED ORDER — NALOXONE HCL 1 MG/ML IJ SOLN
1.0000 ug/kg/h | INTRAVENOUS | Status: DC | PRN
  Filled 2013-06-21: qty 2

## 2013-06-21 MED ORDER — SODIUM CHLORIDE 0.9 % IJ SOLN: 3.0000 mL | INTRAMUSCULAR | Status: DC | PRN

## 2013-06-21 MED ORDER — PRENATAL MULTIVITAMIN CH
1.0000 | ORAL_TABLET | Freq: Every day | ORAL | Status: DC
  Administered 2013-06-22 – 2013-06-23 (×2): 1 via ORAL
  Filled 2013-06-21 (×2): qty 1

## 2013-06-21 MED ORDER — PRENATAL MULTIVITAMIN CH
1.0000 | ORAL_TABLET | Freq: Every day | ORAL | Status: DC
  Administered 2013-06-23 – 2013-06-24 (×2): 1 via ORAL
  Filled 2013-06-21: qty 1

## 2013-06-21 MED ORDER — NALBUPHINE HCL 10 MG/ML IJ SOLN
5.0000 mg | INTRAMUSCULAR | Status: DC | PRN
  Filled 2013-06-21: qty 1

## 2013-06-21 MED ORDER — ONDANSETRON HCL 4 MG/2ML IJ SOLN
INTRAMUSCULAR | Status: DC | PRN
  Administered 2013-06-21: 4 mg via INTRAVENOUS

## 2013-06-21 MED ORDER — METOCLOPRAMIDE HCL 5 MG/ML IJ SOLN: 10.0000 mg | Freq: Three times a day (TID) | INTRAMUSCULAR | Status: DC | PRN

## 2013-06-21 MED ORDER — OXYCODONE-ACETAMINOPHEN 5-325 MG PO TABS
1.0000 | ORAL_TABLET | ORAL | Status: DC | PRN
  Administered 2013-06-22 – 2013-06-23 (×4): 1 via ORAL
  Filled 2013-06-21 (×4): qty 1

## 2013-06-21 MED ORDER — MEPERIDINE HCL 25 MG/ML IJ SOLN: 6.2500 mg | INTRAMUSCULAR | Status: DC | PRN

## 2013-06-21 MED ORDER — KETOROLAC TROMETHAMINE 30 MG/ML IJ SOLN: 30.0000 mg | Freq: Four times a day (QID) | INTRAMUSCULAR | Status: AC | PRN

## 2013-06-21 MED ORDER — TETANUS-DIPHTH-ACELL PERTUSSIS 5-2.5-18.5 LF-MCG/0.5 IM SUSP
0.5000 mL | Freq: Once | INTRAMUSCULAR | Status: AC
  Administered 2013-06-23: 0.5 mL via INTRAMUSCULAR

## 2013-06-21 MED ORDER — DIPHENHYDRAMINE HCL 50 MG/ML IJ SOLN: 12.5000 mg | INTRAMUSCULAR | Status: DC | PRN

## 2013-06-21 MED ORDER — OXYTOCIN 10 UNIT/ML IJ SOLN
40.0000 [IU] | INTRAVENOUS | Status: DC | PRN
  Administered 2013-06-21: 40 [IU] via INTRAVENOUS

## 2013-06-21 MED ORDER — SCOPOLAMINE 1 MG/3DAYS TD PT72
1.0000 | MEDICATED_PATCH | Freq: Once | TRANSDERMAL | Status: DC
  Administered 2013-06-21: 1.5 mg via TRANSDERMAL

## 2013-06-21 MED ORDER — CEFAZOLIN SODIUM-DEXTROSE 2-3 GM-% IV SOLR
2.0000 g | Freq: Once | INTRAVENOUS | Status: AC
  Administered 2013-06-21: 2 g via INTRAVENOUS

## 2013-06-21 MED ORDER — ONDANSETRON HCL 4 MG PO TABS: 4.0000 mg | ORAL_TABLET | ORAL | Status: DC | PRN

## 2013-06-21 MED ORDER — LACTATED RINGERS IV SOLN: INTRAVENOUS | Status: DC

## 2013-06-21 MED ORDER — ONDANSETRON HCL 4 MG/2ML IJ SOLN
INTRAMUSCULAR | Status: AC
  Filled 2013-06-21: qty 2

## 2013-06-21 MED ORDER — LACTATED RINGERS IV SOLN
Freq: Once | INTRAVENOUS | Status: AC
  Administered 2013-06-21: 10:00:00 via INTRAVENOUS

## 2013-06-21 MED ORDER — HYDROMORPHONE HCL PF 1 MG/ML IJ SOLN: 0.2500 mg | INTRAMUSCULAR | Status: DC | PRN

## 2013-06-21 MED ORDER — BUPIVACAINE IN DEXTROSE 0.75-8.25 % IT SOLN
INTRATHECAL | Status: DC | PRN
  Administered 2013-06-21: 10.5 mg via INTRATHECAL

## 2013-06-21 MED ORDER — DIPHENHYDRAMINE HCL 25 MG PO CAPS
25.0000 mg | ORAL_CAPSULE | ORAL | Status: DC | PRN
  Filled 2013-06-21: qty 1

## 2013-06-21 MED ORDER — PROMETHAZINE HCL 25 MG/ML IJ SOLN: 6.2500 mg | INTRAMUSCULAR | Status: DC | PRN

## 2013-06-21 MED ORDER — MORPHINE SULFATE (PF) 0.5 MG/ML IJ SOLN
INTRAMUSCULAR | Status: DC | PRN
  Administered 2013-06-21: .2 ug via INTRATHECAL

## 2013-06-21 MED ORDER — DIPHENHYDRAMINE HCL 50 MG/ML IJ SOLN: 25.0000 mg | INTRAMUSCULAR | Status: DC | PRN

## 2013-06-21 MED ORDER — FENTANYL CITRATE 0.05 MG/ML IJ SOLN
INTRAMUSCULAR | Status: DC | PRN
  Administered 2013-06-21: 12.5 ug via INTRATHECAL

## 2013-06-21 MED ORDER — DIBUCAINE 1 % RE OINT: 1.0000 "application " | TOPICAL_OINTMENT | RECTAL | Status: DC | PRN

## 2013-06-21 MED ORDER — OXYTOCIN 40 UNITS IN LACTATED RINGERS INFUSION - SIMPLE MED: 62.5000 mL/h | INTRAVENOUS | Status: AC

## 2013-06-21 MED ORDER — KETOROLAC TROMETHAMINE 30 MG/ML IJ SOLN
15.0000 mg | Freq: Once | INTRAMUSCULAR | Status: AC | PRN
  Administered 2013-06-21: 30 mg via INTRAVENOUS

## 2013-06-21 MED ORDER — SENNOSIDES-DOCUSATE SODIUM 8.6-50 MG PO TABS
2.0000 | ORAL_TABLET | ORAL | Status: DC
  Administered 2013-06-22 – 2013-06-24 (×2): 2 via ORAL
  Filled 2013-06-21: qty 2

## 2013-06-21 MED ORDER — WITCH HAZEL-GLYCERIN EX PADS: 1.0000 "application " | MEDICATED_PAD | CUTANEOUS | Status: DC | PRN

## 2013-06-21 MED ORDER — KETOROLAC TROMETHAMINE 60 MG/2ML IM SOLN
60.0000 mg | Freq: Once | INTRAMUSCULAR | Status: AC | PRN
  Filled 2013-06-21: qty 2

## 2013-06-21 MED ORDER — NALOXONE HCL 0.4 MG/ML IJ SOLN: 0.4000 mg | INTRAMUSCULAR | Status: DC | PRN

## 2013-06-21 MED ORDER — LACTATED RINGERS IV SOLN
INTRAVENOUS | Status: DC
Start: 2013-06-21 — End: 2013-06-21
  Administered 2013-06-21 (×3): via INTRAVENOUS

## 2013-06-21 MED ORDER — OXYTOCIN 10 UNIT/ML IJ SOLN
INTRAMUSCULAR | Status: AC
  Filled 2013-06-21: qty 4

## 2013-06-21 MED ORDER — IBUPROFEN 600 MG PO TABS
600.0000 mg | ORAL_TABLET | Freq: Four times a day (QID) | ORAL | Status: DC
  Administered 2013-06-22 – 2013-06-24 (×10): 600 mg via ORAL
  Filled 2013-06-21 (×10): qty 1

## 2013-06-21 MED ORDER — SCOPOLAMINE 1 MG/3DAYS TD PT72
MEDICATED_PATCH | TRANSDERMAL | Status: AC
  Administered 2013-06-21: 1.5 mg via TRANSDERMAL
  Filled 2013-06-21: qty 1

## 2013-06-21 MED ORDER — LANOLIN HYDROUS EX OINT: 1.0000 "application " | TOPICAL_OINTMENT | CUTANEOUS | Status: DC | PRN

## 2013-06-21 MED ORDER — FENTANYL CITRATE 0.05 MG/ML IJ SOLN
INTRAMUSCULAR | Status: AC
  Filled 2013-06-21: qty 2

## 2013-06-21 MED ORDER — SIMETHICONE 80 MG PO CHEW
80.0000 mg | CHEWABLE_TABLET | ORAL | Status: DC | PRN
  Administered 2013-06-22: 80 mg via ORAL
  Filled 2013-06-21: qty 1

## 2013-06-21 MED ORDER — MORPHINE SULFATE 0.5 MG/ML IJ SOLN
INTRAMUSCULAR | Status: AC
  Filled 2013-06-21: qty 10

## 2013-06-21 MED ORDER — PHENYLEPHRINE 8 MG IN D5W 100 ML (0.08MG/ML) PREMIX OPTIME
INJECTION | INTRAVENOUS | Status: DC | PRN
  Administered 2013-06-21: 50 ug/min via INTRAVENOUS

## 2013-06-21 MED ORDER — ONDANSETRON HCL 4 MG/2ML IJ SOLN: 4.0000 mg | INTRAMUSCULAR | Status: DC | PRN

## 2013-06-21 MED ORDER — SIMETHICONE 80 MG PO CHEW
80.0000 mg | CHEWABLE_TABLET | ORAL | Status: DC
  Filled 2013-06-21 (×2): qty 1

## 2013-06-21 MED ORDER — DIPHENHYDRAMINE HCL 25 MG PO CAPS: 25.0000 mg | ORAL_CAPSULE | Freq: Four times a day (QID) | ORAL | Status: DC | PRN

## 2013-06-21 MED ORDER — MENTHOL 3 MG MT LOZG: 1.0000 | LOZENGE | OROMUCOSAL | Status: DC | PRN

## 2013-06-21 SURGICAL SUPPLY — 33 items
ADH SKN CLS APL DERMABOND .7 (GAUZE/BANDAGES/DRESSINGS) ×1
CLAMP CORD UMBIL (MISCELLANEOUS) IMPLANT
CLOTH BEACON ORANGE TIMEOUT ST (SAFETY) ×2 IMPLANT
CONTAINER PREFILL 10% NBF 15ML (MISCELLANEOUS) ×4 IMPLANT
DERMABOND ADVANCED (GAUZE/BANDAGES/DRESSINGS) ×1
DERMABOND ADVANCED .7 DNX12 (GAUZE/BANDAGES/DRESSINGS) ×1 IMPLANT
DRAPE LG THREE QUARTER DISP (DRAPES) ×4 IMPLANT
DRSG OPSITE POSTOP 4X10 (GAUZE/BANDAGES/DRESSINGS) ×2 IMPLANT
DURAPREP 26ML APPLICATOR (WOUND CARE) ×2 IMPLANT
ELECT REM PT RETURN 9FT ADLT (ELECTROSURGICAL) ×2
ELECTRODE REM PT RTRN 9FT ADLT (ELECTROSURGICAL) ×1 IMPLANT
EXTRACTOR VACUUM M CUP 4 TUBE (SUCTIONS) IMPLANT
GLOVE BIO SURGEON STRL SZ8.5 (GLOVE) ×2 IMPLANT
GOWN PREVENTION PLUS XLARGE (GOWN DISPOSABLE) ×2 IMPLANT
GOWN PREVENTION PLUS XXLARGE (GOWN DISPOSABLE) ×2 IMPLANT
KIT ABG SYR 3ML LUER SLIP (SYRINGE) IMPLANT
NDL HYPO 25X5/8 SAFETYGLIDE (NEEDLE) IMPLANT
NEEDLE HYPO 25X5/8 SAFETYGLIDE (NEEDLE) IMPLANT
NS IRRIG 1000ML POUR BTL (IV SOLUTION) ×2 IMPLANT
PACK C SECTION WH (CUSTOM PROCEDURE TRAY) ×2 IMPLANT
PAD OB MATERNITY 4.3X12.25 (PERSONAL CARE ITEMS) ×2 IMPLANT
SUT CHROMIC 0 CT 802H (SUTURE) ×2 IMPLANT
SUT CHROMIC 1 CTX 36 (SUTURE) ×4 IMPLANT
SUT CHROMIC 2 0 SH (SUTURE) ×2 IMPLANT
SUT GUT PLAIN 0 CT-3 TAN 27 (SUTURE) ×2 IMPLANT
SUT MON AB 4-0 PS1 27 (SUTURE) ×2 IMPLANT
SUT VIC AB 0 CT1 18XCR BRD8 (SUTURE) IMPLANT
SUT VIC AB 0 CT1 8-18 (SUTURE)
SUT VIC AB 0 CTX 36 (SUTURE) ×4
SUT VIC AB 0 CTX36XBRD ANBCTRL (SUTURE) ×2 IMPLANT
TOWEL OR 17X24 6PK STRL BLUE (TOWEL DISPOSABLE) ×2 IMPLANT
TRAY FOLEY CATH 14FR (SET/KITS/TRAYS/PACK) ×2 IMPLANT
WATER STERILE IRR 1000ML POUR (IV SOLUTION) ×1 IMPLANT

## 2013-06-21 NOTE — Anesthesia Procedure Notes (Signed)
Spinal  Patient location during procedure: OR Start time: 06/21/2013 11:21 AM End time: 06/21/2013 11:23 AM Staffing Anesthesiologist: Leilani Able Performed by: anesthesiologist  Preanesthetic Checklist Completed: patient identified, surgical consent, pre-op evaluation, timeout performed, IV checked, risks and benefits discussed and monitors and equipment checked Spinal Block Patient position: sitting Prep: DuraPrep Patient monitoring: heart rate, cardiac monitor, continuous pulse ox and blood pressure Approach: midline Location: L3-4 Injection technique: single-shot Needle Needle type: Sprotte  Needle gauge: 24 G Needle length: 9 cm Needle insertion depth: 6 cm Assessment Sensory level: T4

## 2013-06-21 NOTE — Anesthesia Postprocedure Evaluation (Signed)
  Anesthesia Post Note  Patient: [name]  Procedure(s) Performed: Procedure(s) (LRB): CESAREAN SECTION WITH BILATERAL TUBAL LIGATION (Bilateral)  Anesthesia type: Spinal  Patient location: PACU  Post pain: Pain level controlled  Post assessment: Post-op Vital signs reviewed  Last Vitals:  Filed Vitals:   06/21/13 1315  BP: 121/104  Pulse: 82  Temp:   Resp: 14    Post vital signs: Reviewed  Level of consciousness: awake  Complications: No apparent anesthesia complications

## 2013-06-21 NOTE — Transfer of Care (Signed)
Immediate Anesthesia Transfer of Care Note  Patient: [name]  Procedure(s) Performed: Procedure(s): CESAREAN SECTION WITH BILATERAL TUBAL LIGATION (Bilateral)  Patient Location: PACU  Anesthesia Type:Spinal  Level of Consciousness: awake, alert  and oriented  Airway & Oxygen Therapy: Patient Spontanous Breathing  Post-op Assessment: Report given to PACU RN and Post -op Vital signs reviewed and stable  Post vital signs: Reviewed and stable  Complications: No apparent anesthesia complications

## 2013-06-21 NOTE — Anesthesia Postprocedure Evaluation (Signed)
  Anesthesia Post-op Note  Anesthesia Post Note  Patient: [name]  Procedure(s) Performed: Procedure(s) (LRB): CESAREAN SECTION WITH BILATERAL TUBAL LIGATION (Bilateral)  Anesthesia type: Spinal  Patient location: Mother/Baby  Post pain: Pain level controlled  Post assessment: Post-op Vital signs reviewed  Last Vitals:  Filed Vitals:   06/21/13 1445  BP: 103/62  Pulse: 60  Temp: 36.4 C  Resp: 16    Post vital signs: Reviewed  Level of consciousness: awake  Complications: No apparent anesthesia complications

## 2013-06-21 NOTE — H&P (Signed)
  There has been no change in her history and physical since the original dictation 

## 2013-06-21 NOTE — Lactation Note (Addendum)
This note was copied from the chart of [name]. Lactation Consultation Note  Patient Name: [name] Date: 06/21/2013 Reason for consult: Initial assessment Baby was at the breast when I arrived demonstrating a good rhythmic suck, swallowing motions noted. Encouraged to BF with feeding ques, STS when Mom is awake. Mom is experienced BF, denies questions or concerns. Basics reviewed. Lactation brochure left for review. Advised of OP services and support group. Will teach hand expression at next visit. Advised to call for assist as needed.  Trinna Post, Spanish interpreter present for visit.   Maternal Data Formula Feeding for Exclusion: No Infant to breast within first hour of birth: Yes Has patient been taught Hand Expression?: No (Will teach at next visit. Baby at the breast at this visit. ) Does the patient have breastfeeding experience prior to this delivery?: Yes  Feeding Feeding Type: Breast Fed  LATCH Score/Interventions Latch: Grasps breast easily, tongue down, lips flanged, rhythmical sucking.  Audible Swallowing: A few with stimulation Intervention(s): Skin to skin;Hand expression  Type of Nipple: Everted at rest and after stimulation  Comfort (Breast/Nipple): Soft / non-tender     Hold (Positioning): Assistance needed to correctly position infant at breast and maintain latch. Intervention(s): Breastfeeding basics reviewed;Support Pillows;Position options;Skin to skin  LATCH Score: 8  Lactation Tools Discussed/Used     Consult Status Consult Status: Follow-up Date: 06/22/13 Follow-up type: In-patient    [name] 06/21/2013, 1:37 PM

## 2013-06-21 NOTE — Op Note (Signed)
preop diagnosis previous cesarean section at term multiparity Postop diagnosis repeat low transverse cesarean section and tubal ligation Surgeon Dr. Francoise Ceo First assistant Dr. Coral Ceo Anesthesia spinal Procedure patient placed on the operating table in the supine position abdomen prepped and draped bladder emptied with a Foley catheter a transverse suprapubic incision made through the old scar carried to the rectus fascia fascia cleaned and incised the length of the incision recti muscles retracted laterally peritoneum incised longitudinally a transverse incision made on the visceroperitoneum above the bladder and the bladder mobilized inferiorly a transverse low uterine incision made the fluid clear the patient delivered of a female Apgar 9 and 9 the placenta was fundal removed manually and sent to labor and delivery the uterine cavity was cleaned with dry laps and the uterine incision closed in one layer with continuous suture of #1 chromic hemostasis was satisfactory bladder flap reattached with 2-0 chromic the right tube was grasped in the midportion with a Babcock clamp 0 plain suture placed in the mesial salpinx below the portion of tube within the clamp this was tied in approximately 1 inch of tube removed procedure done in a similar fashion on the other side lap and sponge counts correct abdomen closed in layers peritoneum continuous with 2-0 chromic fascia continuous within of 0 Dexon and the skin closed with subcuticular stitch of 4-0 Monocryl blood loss was 500 cc and

## 2013-06-21 NOTE — Anesthesia Preprocedure Evaluation (Signed)
Anesthesia Evaluation  Patient identified by MRN, date of birth, ID band Patient awake    Reviewed: Allergy & Precautions, H&P , NPO status , Patient's Chart, lab work & pertinent test results  Airway Mallampati: I TM Distance: >3 FB Neck ROM: full    Dental no notable dental hx.    Pulmonary neg pulmonary ROS,    Pulmonary exam normal       Cardiovascular negative cardio ROS      Neuro/Psych negative neurological ROS  negative psych ROS   GI/Hepatic negative GI ROS, Neg liver ROS,   Endo/Other  negative endocrine ROS  Renal/GU negative Renal ROS  negative genitourinary   Musculoskeletal negative musculoskeletal ROS (+)   Abdominal Normal abdominal exam  (+)   Peds  Hematology negative hematology ROS (+)   Anesthesia Other Findings   Reproductive/Obstetrics (+) Pregnancy                           Anesthesia Physical Anesthesia Plan  ASA: II  Anesthesia Plan: Spinal   Post-op Pain Management:    Induction:   Airway Management Planned:   Additional Equipment:   Intra-op Plan:   Post-operative Plan:   Informed Consent: I have reviewed the patients History and Physical, chart, labs and discussed the procedure including the risks, benefits and alternatives for the proposed anesthesia with the patient or authorized representative who has indicated his/her understanding and acceptance.     Plan Discussed with: CRNA, Surgeon and Anesthesiologist  Anesthesia Plan Comments:         Anesthesia Quick Evaluation

## 2013-06-22 ENCOUNTER — Encounter (HOSPITAL_COMMUNITY): Payer: Self-pay | Admitting: Obstetrics

## 2013-06-22 LAB — CBC
MCHC: 34.8 g/dL (ref 30.0–36.0)
MCV: 82.7 fL (ref 78.0–100.0)
Platelets: 136 10*3/uL — ABNORMAL LOW (ref 150–400)
RBC: 3.06 MIL/uL — ABNORMAL LOW (ref 3.87–5.11)
WBC: 7.4 10*3/uL (ref 4.0–10.5)

## 2013-06-22 LAB — BIRTH TISSUE RECOVERY COLLECTION (PLACENTA DONATION)

## 2013-06-22 NOTE — Progress Notes (Signed)
Patient ID: [name], female   DOB: 1980-12-04, 32 y.o.   MRN: [id number] Postop day 1 Vital signs normal Fundus firm Incision clean and dry lochia moderate doing well

## 2013-06-22 NOTE — Progress Notes (Signed)
UR chart review completed.  

## 2013-06-23 LAB — TYPE AND SCREEN
Antibody Screen: NEGATIVE
Unit division: 0
Unit division: 0

## 2013-06-23 NOTE — Progress Notes (Signed)
Subjective: Postpartum Day 2: Cesarean Delivery Patient reports tolerating PO, + flatus, + BM and no problems voiding.    Objective: Vital signs in last 24 hours: Temp:  [97.6 F (36.4 C)-98.3 F (36.8 C)] 98.3 F (36.8 C) (11/01 0530) Pulse Rate:  [75-85] 85 (11/01 0530) Resp:  [18-20] 20 (11/01 0530) BP: (94-113)/(60-63) 94/60 mmHg (11/01 0530) SpO2:  [97 %-100 %] 97 % (11/01 0530)  Physical Exam:  General: alert and no distress Lochia: appropriate Uterine Fundus: firm Incision: healing well DVT Evaluation: No evidence of DVT seen on physical exam.   Recent Labs  06/22/13 0550  HGB 8.8*  HCT 25.3*    Assessment/Plan: Status post Cesarean section. Doing well postoperatively.  Continue current care.  Rashea Hoskie A 06/23/2013, 8:36 AM

## 2013-06-24 MED ORDER — IBUPROFEN 600 MG PO TABS: 600.0000 mg | ORAL_TABLET | Freq: Four times a day (QID) | ORAL | Status: DC | PRN

## 2013-06-24 MED ORDER — OXYCODONE-ACETAMINOPHEN 5-325 MG PO TABS: 1.0000 | ORAL_TABLET | Freq: Four times a day (QID) | ORAL | Status: DC | PRN

## 2013-06-24 NOTE — Discharge Summary (Signed)
Obstetric Discharge Summary Reason for Admission: cesarean section Prenatal Procedures: ultrasound Intrapartum Procedures: cesarean: low cervical, transverse Postpartum Procedures: none Complications-Operative and Postpartum: none Hemoglobin  Date Value Range Status  06/22/2013 8.8* 12.0 - 15.0 g/dL Final     HCT  Date Value Range Status  06/22/2013 25.3* 36.0 - 46.0 % Final    Physical Exam:  General: alert and no distress Lochia: appropriate Uterine Fundus: firm Incision: healing well DVT Evaluation: No evidence of DVT seen on physical exam.  Discharge Diagnoses: Term Pregnancy-delivered  Discharge Information: Date: 06/24/2013 Activity: pelvic rest Diet: routine Medications: PNV, Ibuprofen, Iron and Percocet Condition: stable Instructions: refer to practice specific booklet Discharge to: home Follow-up Information   Follow up with MARSHALL,BERNARD A, MD In 6 weeks.   Specialty:  Obstetrics and Gynecology   Contact information:   [address] [place] [address] [phone]       Newborn Data: Live born female  Birth Weight: 7 lb 1.6 oz (3221 g) APGAR: 9, 9  Home with mother.  Kainat Pizana A 06/24/2013, 6:18 AM

## 2013-06-24 NOTE — Progress Notes (Signed)
Subjective: Postpartum Day 3: Cesarean Delivery Patient reports tolerating PO, + flatus, + BM and no problems voiding.    Objective: Vital signs in last 24 hours: Temp:  [98.1 F (36.7 C)] 98.1 F (36.7 C) (11/01 1830) Pulse Rate:  [80] 80 (11/01 1830) BP: (97)/(60) 97/60 mmHg (11/01 1830) SpO2:  [98 %] 98 % (11/01 1830)  Physical Exam:  General: alert and no distress Lochia: appropriate Uterine Fundus: firm Incision: healing well DVT Evaluation: No evidence of DVT seen on physical exam.   Recent Labs  06/22/13 0550  HGB 8.8*  HCT 25.3*    Assessment/Plan: Status post Cesarean section. Doing well postoperatively.  Discharge home with standard precautions and return to clinic in 4-6 weeks.  HARPER,CHARLES A 06/24/2013, 6:13 AM

## 2013-06-28 ENCOUNTER — Inpatient Hospital Stay (HOSPITAL_COMMUNITY): Admission: AD | Admit: 2013-06-28 | Payer: Self-pay | Source: Ambulatory Visit | Admitting: Obstetrics

## 2014-06-24 ENCOUNTER — Encounter (HOSPITAL_COMMUNITY): Payer: Self-pay | Admitting: Obstetrics

## 2015-12-18 ENCOUNTER — Ambulatory Visit (INDEPENDENT_AMBULATORY_CARE_PROVIDER_SITE_OTHER): Payer: Self-pay | Admitting: Internal Medicine

## 2015-12-18 ENCOUNTER — Encounter: Payer: Self-pay | Admitting: Internal Medicine

## 2015-12-18 VITALS — BP 110/78 | HR 62 | Resp 18 | Ht <= 58 in | Wt 168.5 lb

## 2015-12-18 DIAGNOSIS — K0889 Other specified disorders of teeth and supporting structures: Secondary | ICD-10-CM

## 2015-12-18 NOTE — Progress Notes (Signed)
   Subjective:    Patient ID: Christine ReiningElvia Berg, female    DOB: 15-Feb-1981, 35 y.o.   MRN: [id number]  HPI  Here to establish  1.  Wants to get set up for CPE with pap.  2.  Temperature sensitive teeth, right maxillary area.  Notes cavities there and would like to get set up with dental clinic.  Taking unknown antibiotic from an Stephenie AcresEric Sadler, DDS.  Sounds like Amoxicillin 3 times daily to take for 7 more days after had a root canal about 6 days ago.   Pain is improving, but she has other teeth that need attention.  No current outpatient prescriptions on file.   No Known Allergies   Past Medical History  Diagnosis Date  . SVD (spontaneous vaginal delivery) 2005    x 1    Past Surgical History  Procedure Laterality Date  . Cesarean section with bilateral tubal ligation Bilateral 06/21/2013    Procedure: CESAREAN SECTION WITH BILATERAL TUBAL LIGATION;  Surgeon: Kathreen CosierBernard A Marshall, MD;  Location: [facility];  Service: Obstetrics;  Laterality: Bilateral;  . Cesarean section  2009, 2014  . Tubal ligation  2014   Family History  Problem Relation Age of Onset  . Osteoporosis Mother   . Hypertension Mother   . Heart disease Father   . Schizophrenia Brother    Social History   Social History  . Marital Status: Married    Spouse Name: Elgie Congolvaro Parada Ojera  . Number of Children: 3  . Years of Education: 12   Occupational History  . Homemaker    Social History Main Topics  . Smoking status: Never Smoker   . Smokeless tobacco: Never Used  . Alcohol Use: No  . Drug Use: No  . Sexual Activity: Yes    Birth Control/ Protection: None     Comment: pregnant   Other Topics Concern  . Not on file   Social History Narrative   Originally [place]   [place]   Lives at home with husband and 3 children.      Review of Systems     Objective:   Physical Exam NAD HEENT;  PERRL, EOMI, discs sharp, TMs pearly gray, throat without injection, scattered dental decay.   No swelling or erythema of gingiva Neck:  Supple, No adenopathy, no thyromegaly, NT over traps, cspine processes Chest:  CTA CV:  RRR with normal S1 and S2, No S3, S4 or murmur, radial and DP pulses normal and equal. Abd:  S, NT, No HSM or mass, +BS LE:  No edema       Assessment & Plan:  1.  Dental Pain:  Cannot afford to continue with current dentist.  Would like referral to Dental Clinic with orange card.  2.  Needs CPE/pap--schedule in next 3- months.

## 2016-01-22 ENCOUNTER — Ambulatory Visit: Payer: No Typology Code available for payment source | Admitting: Internal Medicine

## 2016-04-15 ENCOUNTER — Ambulatory Visit (INDEPENDENT_AMBULATORY_CARE_PROVIDER_SITE_OTHER): Payer: Self-pay | Admitting: Internal Medicine

## 2016-04-15 ENCOUNTER — Other Ambulatory Visit: Payer: Self-pay

## 2016-04-15 ENCOUNTER — Encounter: Payer: Self-pay | Admitting: Internal Medicine

## 2016-04-15 VITALS — BP 112/78 | HR 60 | Temp 97.4°F | Resp 16 | Ht <= 58 in | Wt 170.0 lb

## 2016-04-15 DIAGNOSIS — Z0000 Encounter for general adult medical examination without abnormal findings: Secondary | ICD-10-CM

## 2016-04-15 DIAGNOSIS — Z13220 Encounter for screening for lipoid disorders: Secondary | ICD-10-CM

## 2016-04-15 DIAGNOSIS — Z124 Encounter for screening for malignant neoplasm of cervix: Secondary | ICD-10-CM

## 2016-04-15 DIAGNOSIS — N898 Other specified noninflammatory disorders of vagina: Secondary | ICD-10-CM

## 2016-04-15 LAB — POCT WET PREP WITH KOH
KOH PREP POC: NEGATIVE
RBC WET PREP PER HPF POC: NEGATIVE
Trichomonas, UA: NEGATIVE
Yeast Wet Prep HPF POC: NEGATIVE

## 2016-04-15 MED ORDER — METRONIDAZOLE 500 MG PO TABS: ORAL_TABLET | ORAL | 0 refills | Status: DC

## 2016-04-15 NOTE — Progress Notes (Signed)
Subjective:    Patient ID: [name], female    DOB: 04-07-1981, 35 y.o.   MRN: [id number]  HPI   CPE with pap  Health Maintenance:  1.  Last Pap:  Not clear, but could be 2011.  Abnormal in 2009, but resolved to normal thereafter  2.  Mammogram:  Never.  3.  SBE:  Once weekly in shower  4.  Cholesterol:  Not clear ever checked.  Immunization History  Administered Date(s) Administered  . Influenza Whole 08/04/2010  . Tdap 06/23/2013   No outpatient prescriptions have been marked as taking for the 04/15/16 encounter (Office Visit) with Julieanne Manson, MD.   No Known Allergies   Past Medical History:  Diagnosis Date  . SVD (spontaneous vaginal delivery) 2005   x 1    Past Surgical History:  Procedure Laterality Date  . CESAREAN SECTION  2009, 2014  . CESAREAN SECTION WITH BILATERAL TUBAL LIGATION Bilateral 06/21/2013   Procedure: CESAREAN SECTION WITH BILATERAL TUBAL LIGATION;  Surgeon: Kathreen Cosier, MD;  Location: [facility];  Service: Obstetrics;  Laterality: Bilateral;  . TUBAL LIGATION  2014    Family History  Problem Relation Age of Onset  . Osteoporosis Mother   . Hypertension Mother   . Heart disease Father   . Schizophrenia Brother    Social History   Social History  . Marital status: Married    Spouse name: Elgie Congo  . Number of children: 3  . Years of education: 12   Occupational History  . Homemaker    Social History Main Topics  . Smoking status: Never Smoker  . Smokeless tobacco: Never Used  . Alcohol use No  . Drug use: No  . Sexual activity: Yes    Birth control/ protection: Surgical     Comment: pregnant   Other Topics Concern  . Not on file   Social History Narrative   Originally from [place]   [place] to [place] In [place]   Lives at home with husband and 3 children.       Review of Systems  Constitutional: Negative for fatigue, fever and unexpected weight change.  HENT: Positive for dental problem.  Negative for congestion, ear pain, rhinorrhea and sore throat.        Was seen at dental clinic. Had initial visit and goes back for focus on her problematic tooth  Eyes: Negative for pain, redness and visual disturbance.  Respiratory: Negative for cough and shortness of breath.   Cardiovascular: Negative for chest pain, palpitations and leg swelling.  Gastrointestinal: Negative for abdominal pain, blood in stool, constipation, diarrhea and nausea.  Endocrine: Positive for polydipsia and polyuria.  Genitourinary: Negative for dysuria, frequency, hematuria, urgency and vaginal discharge.  Musculoskeletal: Negative for arthralgias and joint swelling.  Skin:       No skin lesions with change.  Neurological: Negative for weakness, numbness and headaches.  Hematological: Does not bruise/bleed easily.  Psychiatric/Behavioral: Negative for dysphoric mood and suicidal ideas. The patient is not nervous/anxious.    Depression screen [facility] 2/9 04/15/2016  Decreased Interest 0  Down, Depressed, Hopeless 0  PHQ - 2 Score 0  Altered sleeping 0  Tired, decreased energy 0  Change in appetite 0  Feeling bad or failure about yourself  0  Trouble concentrating 0  Moving slowly or fidgety/restless 0  Suicidal thoughts 0  PHQ-9 Score 0  Difficult doing work/chores Not difficult at all       Objective:   Physical  Exam  Constitutional: She is oriented to person, place, and time. She appears well-developed and well-nourished.  HENT:  Head: Normocephalic.  Right Ear: Hearing, tympanic membrane, external ear and ear canal normal.  Left Ear: Hearing, tympanic membrane, external ear and ear canal normal.  Nose: Nose normal. No mucosal edema.  Mouth/Throat: Uvula is midline, oropharynx is clear and moist and mucous membranes are normal. No oropharyngeal exudate.  Eyes: Conjunctivae and EOM are normal. Pupils are equal, round, and reactive to light.  Discs sharp bilaterally  Neck: Normal range of motion.  Neck supple. No thyroid mass and no thyromegaly present.  Cardiovascular: Normal rate, regular rhythm, S1 normal, S2 normal and normal pulses.  Exam reveals no S3, no S4 and no friction rub.   No murmur heard. Pulses:      Carotid pulses are 2+ on the right side, and 2+ on the left side.      Radial pulses are 2+ on the right side, and 2+ on the left side.       Femoral pulses are 2+ on the right side, and 2+ on the left side.      Popliteal pulses are 2+ on the right side, and 2+ on the left side.       Dorsalis pedis pulses are 2+ on the right side, and 2+ on the left side.       Posterior tibial pulses are 2+ on the right side, and 2+ on the left side.  Pulmonary/Chest: Effort normal and breath sounds normal. Right breast exhibits no inverted nipple, no mass, no nipple discharge and no skin change. Left breast exhibits no inverted nipple, no mass, no nipple discharge, no skin change and no tenderness.  Abdominal: Soft. Bowel sounds are normal. She exhibits no mass. There is no hepatosplenomegaly. There is no tenderness. No hernia.  Genitourinary: There is no rash on the right labia. There is no rash on the left labia. Uterus is not enlarged and not tender. Cervix exhibits no motion tenderness and no friability. Right adnexum displays no mass and no tenderness. Left adnexum displays no mass and no tenderness. Vaginal discharge found.  Genitourinary Comments: Yellow thick discharge with mild underlying edema without erythema of vaginal mucosa.     Musculoskeletal: Normal range of motion. She exhibits no tenderness.  Lymphadenopathy:       Head (right side): No submental and no submandibular adenopathy present.       Head (left side): No submental and no submandibular adenopathy present.    She has no cervical adenopathy.    She has no axillary adenopathy.       Right: No inguinal and no supraclavicular adenopathy present.       Left: No inguinal and no supraclavicular adenopathy present.    Neurological: She is alert and oriented to person, place, and time. She has normal strength and normal reflexes. No cranial nerve deficit or sensory deficit. Coordination and gait normal.  Skin: Skin is warm and dry. No rash noted.  1/2 cm well circumscribed elevated domed light brown lesion at nape of left neck.  Dark brown similar lesion on upper right back  Psychiatric: She has a normal mood and affect. Her speech is normal and behavior is normal. Judgment and thought content normal. Cognition and memory are normal.          Assessment & Plan:  1.  Annual Exam with pap Encouraged flu vaccine in next 2 months. Encouraged improved diet and increased physical activity for  weight loss.  To call if needs extra help with this CBC, CMP, FLP Pap sent.  2.  Bacterial Vaginosis:  Metronidazole 500 mg twice daily for 7 days.  3.  Skin lesion, left neck:  Discussed could remove if getting in the way, but not necessary.

## 2016-04-16 LAB — CBC WITH DIFFERENTIAL/PLATELET
Basophils Absolute: 0 10*3/uL (ref 0.0–0.2)
Basos: 0 %
EOS (ABSOLUTE): 0.1 10*3/uL (ref 0.0–0.4)
Eos: 2 %
Hematocrit: 38.1 % (ref 34.0–46.6)
Hemoglobin: 12.3 g/dL (ref 11.1–15.9)
Immature Grans (Abs): 0 10*3/uL (ref 0.0–0.1)
Immature Granulocytes: 0 %
Lymphocytes Absolute: 1.8 10*3/uL (ref 0.7–3.1)
Lymphs: 35 %
MCH: 28.4 pg (ref 26.6–33.0)
MCHC: 32.3 g/dL (ref 31.5–35.7)
MCV: 88 fL (ref 79–97)
MONOS ABS: 0.3 10*3/uL (ref 0.1–0.9)
Monocytes: 5 %
Neutrophils Absolute: 3 10*3/uL (ref 1.4–7.0)
Neutrophils: 58 %
PLATELETS: 218 10*3/uL (ref 150–379)
RBC: 4.33 x10E6/uL (ref 3.77–5.28)
RDW: 13.8 % (ref 12.3–15.4)
WBC: 5.2 10*3/uL (ref 3.4–10.8)

## 2016-04-16 LAB — COMPREHENSIVE METABOLIC PANEL
ALBUMIN: 4.2 g/dL (ref 3.5–5.5)
ALT: 17 IU/L (ref 0–32)
AST: 21 IU/L (ref 0–40)
Albumin/Globulin Ratio: 1.4 (ref 1.2–2.2)
Alkaline Phosphatase: 84 IU/L (ref 39–117)
BUN/Creatinine Ratio: 13 (ref 9–23)
BUN: 10 mg/dL (ref 6–20)
Bilirubin Total: 0.5 mg/dL (ref 0.0–1.2)
CO2: 25 mmol/L (ref 18–29)
CREATININE: 0.77 mg/dL (ref 0.57–1.00)
Calcium: 9.2 mg/dL (ref 8.7–10.2)
Chloride: 99 mmol/L (ref 96–106)
GFR calc Af Amer: 118 mL/min/{1.73_m2} (ref >59)
GFR, EST NON AFRICAN AMERICAN: 102 mL/min/{1.73_m2} (ref >59)
Globulin, Total: 2.9 g/dL (ref 1.5–4.5)
Glucose: 84 mg/dL (ref 65–99)
Potassium: 4.2 mmol/L (ref 3.5–5.2)
SODIUM: 138 mmol/L (ref 134–144)
Total Protein: 7.1 g/dL (ref 6.0–8.5)

## 2016-04-16 LAB — LIPID PANEL W/O CHOL/HDL RATIO
Cholesterol, Total: 139 mg/dL (ref 100–199)
HDL: 49 mg/dL (ref >39)
LDL CALC: 75 mg/dL (ref 0–99)
Triglycerides: 76 mg/dL (ref 0–149)
VLDL CHOLESTEROL CAL: 15 mg/dL (ref 5–40)

## 2016-04-19 LAB — CYTOLOGY - PAP

## 2016-07-01 ENCOUNTER — Ambulatory Visit (INDEPENDENT_AMBULATORY_CARE_PROVIDER_SITE_OTHER): Payer: Self-pay | Admitting: Internal Medicine

## 2016-07-01 ENCOUNTER — Encounter: Payer: Self-pay | Admitting: Internal Medicine

## 2016-07-01 VITALS — BP 102/70 | HR 78 | Resp 12 | Ht 58.75 in | Wt 168.0 lb

## 2016-07-01 DIAGNOSIS — Z124 Encounter for screening for malignant neoplasm of cervix: Secondary | ICD-10-CM

## 2016-07-01 DIAGNOSIS — N898 Other specified noninflammatory disorders of vagina: Secondary | ICD-10-CM

## 2016-07-01 LAB — POCT WET PREP WITH KOH
KOH Prep POC: NEGATIVE
RBC Wet Prep HPF POC: NEGATIVE
TRICHOMONAS UA: NEGATIVE
YEAST WET PREP PER HPF POC: NEGATIVE

## 2016-07-01 NOTE — Progress Notes (Addendum)
   Subjective:    Patient ID: Christine ReiningElvia Berg, female    DOB: 06-Nov-1980, 35 y.o.   MRN: [id number]  HPI   Here for repeat pap.   For some reason, pap on 04/15/16  Unsatisfactory for evaluation--scant cellularity.  No outpatient prescriptions have been marked as taking for the 07/01/16 encounter (Office Visit) with Julieanne MansonElizabeth Tandi Hanko, MD.    No Known Allergies    Review of Systems     Objective:   Physical Exam  GU:  Normal appearing cervix, scant yellow discharge, No vaginal mucosal abnormality.  Pap and wet prep taken.      Assessment & Plan:  1.  Repeat pap for inadequate specimen from CPE.  Wet prep inadvertently added--wrong chart--did not have BV this visit.

## 2016-07-02 LAB — CYTOLOGY - PAP

## 2016-08-28 NOTE — Addendum Note (Signed)
Addended by: Marcene DuosMULBERRY, Kiondra Caicedo M on: 08/28/2016 08:48 PM   Modules accepted: Orders

## 2018-10-16 ENCOUNTER — Other Ambulatory Visit: Payer: Self-pay | Admitting: Nurse Practitioner

## 2018-10-16 DIAGNOSIS — Z124 Encounter for screening for malignant neoplasm of cervix: Secondary | ICD-10-CM

## 2018-10-16 NOTE — Progress Notes (Signed)
Patient: [name]           Date of Birth: April 22, 1981           MRN: [id number] Visit Date: 10/16/2018 PCP: Julieanne Manson, MD  Cervical Cancer Screening Do you smoke?: No Have you ever had or been told you have an allergy to latex products?: No Marital status: Married Date of last pap smear: More than 5 yrs ago Date of last menstrual period: 10/12/18 Number of pregnancies: 3 Number of births: 3 Have you ever had any of the following? Hysterectomy: No Tubal ligation (tubes tied): Yes Abnormal bleeding: No Abnormal pap smear: Yes Venereal warts: No A sex partner with venereal warts: No A high risk* sex partner: No  Cervical Exam  Abnormal Observations: None Recommendations: Follow up by pap guidelines.      Patient's History There are no active problems to display for this patient.  Past Medical History:  Diagnosis Date  . SVD (spontaneous vaginal delivery) 2005   x 1    Family History  Problem Relation Age of Onset  . Osteoporosis Mother   . Hypertension Mother   . Heart disease Father   . Schizophrenia Brother     Social History   Occupational History  . Occupation: Homemaker  Tobacco Use  . Smoking status: Never Smoker  . Smokeless tobacco: Never Used  Substance and Sexual Activity  . Alcohol use: No  . Drug use: No  . Sexual activity: Yes    Birth control/protection: Surgical    Comment: pregnant

## 2018-10-16 NOTE — Addendum Note (Signed)
Addended by: Lynnell Dike on: 10/16/2018 08:21 PM   Modules accepted: Orders

## 2018-10-19 LAB — CYTOLOGY - PAP: Diagnosis: NEGATIVE

## 2018-11-20 ENCOUNTER — Telehealth (HOSPITAL_COMMUNITY): Payer: Self-pay | Admitting: *Deleted

## 2018-11-20 NOTE — Telephone Encounter (Signed)
Normal Pap smear result letter mailed to patient by Cytology. 

## 2023-11-03 ENCOUNTER — Telehealth: Payer: Self-pay

## 2023-11-03 NOTE — Telephone Encounter (Signed)
 Telephoned patient using interpreter, [name]. Mailed patient [facility] medical release form. Patient will call back after returning form to the [facility]. [facility]

## 2023-12-06 NOTE — Telephone Encounter (Signed)
 Telephoned patient using interpreter, Mechele Spiegel. Patient confirmed she has [employer] coverage. [facility] confirmed last mammogram 12/21/2021. [facility]

## 2023-12-28 LAB — BASIC METABOLIC PANEL WITH GFR: Glucose: 129

## 2023-12-28 NOTE — Congregational Nurse Program (Signed)
  Dept: [phone]   Congregational Nurse Program Note  Date of Encounter: 12/28/2023 BP 114/70 (BP Location: Right Arm, Patient Position: Sitting, Cuff Size: Normal)  Pt states has not seen a doctor since her 43 year old was born. Pt request dr., pap, and mammogram. Pt states is uninsured but has a Development worker, community. Pt ref to on site SW to clarify her insurance status. Pt will return in the coming weeks to verify her insurance needs, then we will seek an appt with primary care.  Pheobe Brass, RN, CCNP   Past Medical History: Past Medical History:  Diagnosis Date   SVD (spontaneous vaginal delivery) 2005   x 1    Encounter Details:  Community Questionnaire - 12/28/23 1610       Questionnaire   Ask client: Do you give verbal consent for me to treat you today? Yes    Student Assistance N/A    Location Patient Served  [facility]    Encounter Setting CN site    Population Status Migrant/Refugee    Insurance Uninsured ([employer] Card/Care Connects/Self-Pay/Medicaid Family Planning);Medicaid    Insurance/Financial Assistance Referral [facility];Medicaid    Medication N/A    Medical Provider No    Screening Referrals Made N/A    Medical Referrals Made [facility]    Medical Appointment Completed N/A    CNP Interventions Advocate/Support;Navigate Healthcare System;Educate    Screenings CN Performed Blood Pressure;Blood Glucose    ED Visit Averted N/A    Life-Saving Intervention Made N/A

## 2024-01-04 NOTE — Congregational Nurse Program (Signed)
  Dept: [phone]   Congregational Nurse Program Note  Date of Encounter: 01/04/2024 BP 118/68 (BP Location: Right Arm, Patient Position: Sitting, Cuff Size: Normal)   Pulse 71  Pheobe Brass, RN, CCNP   Past Medical History: Past Medical History:  Diagnosis Date   SVD (spontaneous vaginal delivery) 2005   x 1    Encounter Details:  Community Questionnaire - 01/04/24 1544       Questionnaire   Ask client: Do you give verbal consent for me to treat you today? Yes    Student Assistance N/A    Location Patient Served  [facility]    Encounter Setting CN site    Population Status Migrant/Refugee    Insurance Uninsured ([employer] Card/Care Connects/Self-Pay/Medicaid Family Planning);Medicaid    Insurance/Financial Assistance Referral Medicaid    Medication N/A    Medical Provider Yes    Screening Referrals Made N/A    Medical Referrals Made N/A    Medical Appointment Completed N/A    CNP Interventions Advocate/Support;Educate    Screenings CN Performed Blood Pressure;Blood Glucose    ED Visit Averted N/A    Life-Saving Intervention Made N/A

## 2024-03-27 ENCOUNTER — Ambulatory Visit: Payer: Self-pay | Admitting: Internal Medicine

## 2024-03-27 ENCOUNTER — Encounter: Payer: Self-pay | Admitting: Internal Medicine

## 2024-03-27 VITALS — BP 110/72 | HR 75 | Resp 18 | Ht 60.0 in | Wt 174.0 lb

## 2024-03-27 DIAGNOSIS — G5601 Carpal tunnel syndrome, right upper limb: Secondary | ICD-10-CM

## 2024-03-27 DIAGNOSIS — B372 Candidiasis of skin and nail: Secondary | ICD-10-CM

## 2024-03-27 DIAGNOSIS — M25511 Pain in right shoulder: Secondary | ICD-10-CM

## 2024-03-27 DIAGNOSIS — G8929 Other chronic pain: Secondary | ICD-10-CM

## 2024-03-27 MED ORDER — CLOTRIMAZOLE 1 % EX CREA: TOPICAL_CREAM | CUTANEOUS | Status: AC

## 2024-03-27 NOTE — Progress Notes (Signed)
 Subjective:    Patient ID: [name], female   DOB: July 20, 1981, 43 y.o.   MRN: [id number]   HPI  Erminio Bloomer interprets   Rash/skin discoloration for 2 months.  Started  much smaller and gradually involved larger are of skin.  States the area was a bit darker.  Itchy from the start.  Felt skin thickening in the area of itching, but no blistering or bumps or definite raised area.   Current size reached about 1 month ago. Started Neosporin about 2 weeks ago.   Noted flaking and so started the Neosporin.  Flaking seems to be better.  Flaking involves the whole lesion, not just edges or central area.  Itching did not worsen following use of Neosporin. No one else in household has a rash. They do have chickens, goats, rabbits. Chickens have problem with eyes and she treats the eyes wearing gloves.  Yellow stuff out of their eyes.   She does not use any other medications other than Neosporin.     No new exposures to personal hygiene products.   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    2. Right shoulder pain with tingling down her arm at night when trying to sleep.  Started about 7 months ago.  Better since stopped working in June.  Not awakening her from sleep and actually resolved in July.   No history of acute injury prior to the pain starting.   Apparently, seems she has had the pain previously when using her hands a lot.   She notes when she is pushing or pulling with her right arm a lot.  Has resolved with complete stoppage of this movement with her shoulder before.   Sounds like she also would have numbness of her hand awaken her and would shake out with resolution of numbness.       No outpatient medications have been marked as taking for the 03/27/24 encounter (Office Visit) with Adella Norris, MD.   No  Known Allergies  Past Medical History:  Diagnosis Date   SVD (spontaneous vaginal delivery) 2005   x 1   Past Surgical History:  Procedure Laterality Date   CESAREAN SECTION  2009, 2014   CESAREAN SECTION WITH BILATERAL TUBAL LIGATION Bilateral 06/21/2013   Procedure: CESAREAN SECTION WITH BILATERAL TUBAL LIGATION;  Surgeon: Aida DELENA Na, MD;  Location: [facility];  Service: Obstetrics;  Laterality: Bilateral;   Family History  Problem Relation Age of Onset   Osteoporosis Mother    Hypertension Mother    GER disease Mother    Heart disease Father    Schizophrenia Brother    Social History   Socioeconomic History   Marital status: Married    Spouse name: Camillia Kelvin Mins   Number of children: 3   Years of education: 12   Highest education level: Not on file  Occupational History   Occupation: Homemaker  Tobacco Use   Smoking status: Never    Passive exposure: Never   Smokeless tobacco: Never  Vaping Use   Vaping status: Never Used  Substance and Sexual Activity   Alcohol use: No   Drug use: No   Sexual activity: Yes    Birth control/protection: Surgical    Comment: pregnant  Other Topics Concern   Not on file  Social History Narrative  Lives at home with husband and 3 children.   Social Drivers of Corporate investment banker Strain: Low Risk  (03/27/2024)   Overall Financial Resource Strain (CARDIA)    Difficulty of Paying Living Expenses: Not very hard  Food Insecurity: No Food Insecurity (03/27/2024)   Hunger Vital Sign    Worried About Running Out of Food in the Last Year: Never true    Ran Out of Food in the Last Year: Never true  Transportation Needs: No Transportation Needs (03/27/2024)   PRAPARE - Administrator, Civil Service (Medical): No    Lack of Transportation (Non-Medical): No  Physical Activity: Not on file  Stress: Not on file  Social Connections: Unknown (12/21/2021)   [facility]   Social Network    Social  Network: Not on file  Intimate Partner Violence: Not At Risk (03/27/2024)   Humiliation, Afraid, Rape, and Kick questionnaire    Fear of Current or Ex-Partner: No    Emotionally Abused: No    Physically Abused: No    Sexually Abused: No       Review of Systems    Objective:   BP 110/72 (BP Location: Left Arm, Patient Position: Sitting, Cuff Size: Normal)   Pulse 75   Resp 18   Ht 5' (1.524 m)   Wt 174 lb (78.9 kg)   BMI 33.98 kg/m   Physical Exam Cardiovascular:     Rate and Rhythm: Normal rate and regular rhythm.     Pulses: Normal pulses.     Heart sounds: Normal heart sounds. No murmur heard.    No friction rub.  Pulmonary:     Effort: Pulmonary effort is normal.     Breath sounds: Normal breath sounds.  Musculoskeletal:     Right shoulder: No swelling, deformity, effusion or bony tenderness. Normal range of motion (some discomfort in shoulder with slow return to side from full abduction at about 90 degrees). Decreased strength (With arm abducted and internally rotated against downward force on distal arm.  Pain with the downward force.).  Skin:    General: Skin is warm.     Findings: Lesion (Hyperpigmented homongenous appearing large ovoid area at right axillary/right lateral chest area.  No central clearing.  No built up edges.  lichenification.throughout.) present.  Neurological:     Mental Status: She is alert.     Sensory: Sensation is intact.     Motor: Motor function is intact.     Deep Tendon Reflexes: Reflexes are normal and symmetric.     Comments: + Tinels over volar right wrist.  Unable to elicit Phalens.      Assessment & Plan   Yeast dermatitis:  Clotrimazole  1% cream twice daily for at least 14 days or until resolves and continue for 14 more days afterward.  Call if does not improve and resolve.  2.  Right shoulder likely rotator cuff tendinitis.  Suspect supraspinatus.  Referral to [facility] PT.  3.  Carpal Tunnel syndrome on right:   ibuprofen  600 mg twice daily for 14 days and cock up splint nightly.

## 2024-10-19 ENCOUNTER — Other Ambulatory Visit

## 2024-10-23 ENCOUNTER — Encounter: Admitting: Internal Medicine
